# Patient Record
Sex: Female | Born: 1995 | Race: White | Hispanic: No | Marital: Single | Smoking: Former smoker
Health system: Southern US, Community
[De-identification: ages and names within clinical notes are randomized; demographics above are authoritative.]

## PROBLEM LIST (undated history)

## (undated) DIAGNOSIS — Z789 Other specified health status: Secondary | ICD-10-CM

## (undated) HISTORY — PX: NO PAST SURGERIES: SHX2092

---

## 2018-04-16 NOTE — L&D Delivery Note (Signed)
Delivery Note At 9:28 AM a viable female was delivered via Vaginal, Spontaneous (Presentation: LOA;  ).  APGAR: 8, 9; weight pending.   Placenta status: spontaneous, intact.  Cord: 3 vessels with the following complications: nuchal x1  Anesthesia:  epidural Episiotomy: None Lacerations: 2nd degree;Perineal Suture Repair: 2.0 vicryl, 3.0 vicryl Est. Blood Loss (mL):  102  Mom to postpartum.  Baby to Couplet care / Skin to Skin.  Wende Mott CNM 01/17/2019, 9:56 AM

## 2018-06-03 ENCOUNTER — Ambulatory Visit (INDEPENDENT_AMBULATORY_CARE_PROVIDER_SITE_OTHER): Payer: Medicaid Other | Admitting: Advanced Practice Midwife

## 2018-06-03 ENCOUNTER — Encounter: Payer: Self-pay | Admitting: Advanced Practice Midwife

## 2018-06-03 ENCOUNTER — Other Ambulatory Visit (HOSPITAL_COMMUNITY)
Admission: RE | Admit: 2018-06-03 | Discharge: 2018-06-03 | Disposition: A | Discharge status: Home / Self Care | Payer: Medicaid Other | Source: Ambulatory Visit | Attending: Advanced Practice Midwife | Admitting: Advanced Practice Midwife

## 2018-06-03 DIAGNOSIS — Z3400 Encounter for supervision of normal first pregnancy, unspecified trimester: Secondary | ICD-10-CM | POA: Diagnosis not present

## 2018-06-03 DIAGNOSIS — Z3A01 Less than 8 weeks gestation of pregnancy: Secondary | ICD-10-CM | POA: Diagnosis not present

## 2018-06-03 DIAGNOSIS — Z3401 Encounter for supervision of normal first pregnancy, first trimester: Secondary | ICD-10-CM | POA: Diagnosis not present

## 2018-06-03 LAB — POCT URINALYSIS DIPSTICK OB
Blood, UA: NEGATIVE
GLUCOSE, UA: NEGATIVE
Leukocytes, UA: NEGATIVE
Nitrite, UA: NEGATIVE
POC,PROTEIN,UA: NEGATIVE
Spec Grav, UA: 1.02 (ref 1.010–1.025)
pH, UA: 6 (ref 5.0–8.0)

## 2018-06-03 NOTE — Progress Notes (Signed)
DATING AND VIABILITY SONOGRAM   Kelsey Koch [facility] is a 23 y.o. year old G1P0 with LMP Patient's last menstrual period was 04/11/2018 (exact date). which would correlate to  [redacted]w[redacted]d weeks gestation.  She has regular menstrual cycles.   She is here today for a confirmatory initial sonogram. Transvaginal ultrasound used.    GESTATION: SINGLETON     FETAL ACTIVITY:          Heart rate       146          The fetus is active.    ADNEXA: The ovaries are normal.   GESTATIONAL AGE AND  BIOMETRICS:  Gestational criteria: Estimated Date of Delivery: 01/16/19 by LMP now at [redacted]w[redacted]d  Previous Scans:0      CROWN RUMP LENGTH           1.0 cm      7-0 weeks                                                                               AVERAGE EGA(BY THIS SCAN): 7-0weeks  WORKING EDD( LMP ):  01/16/2019     TECHNICIAN COMMENTS:  Patient informed that the ultrasound is considered a limited obstetric ultrasound and is not intended to be a complete ultrasound exam. Patient also informed that the ultrasound is not being completed with the intent of assessing for fetal or placental anomalies or any pelvic abnormalities. Explained that the purpose of today's ultrasound is to assess for fetal heart rate. Patient acknowledges the purpose of the exam and the limitations of the study.    Armandina Stammer 06/03/2018 10:29 AM

## 2018-06-03 NOTE — Progress Notes (Signed)
  Subjective:    Kelsey Koch [facility] is a G1P0 [redacted]w[redacted]d being seen today for her first obstetrical visit.  Her obstetrical history is significant for primagravida. Patient does intend to breast feed. Pregnancy history fully reviewed.  Patient reports some nausea not requiring meds.  .  Vitals:   06/03/18 1003 06/03/18 1004  BP: 115/76   Pulse: 84   Weight: 62.1 kg   Height:  5\' 5"  (1.651 m)    HISTORY: OB History  Gravida Para Term Preterm AB Living  1            SAB TAB Ectopic Multiple Live Births               # Outcome Date GA Lbr Len/2nd Weight Sex Delivery Anes PTL Lv  1 Current            History reviewed. No pertinent past medical history. History reviewed. No pertinent surgical history. Family History  Problem Relation Age of Onset  . Ovarian cysts Mother      Exam    Uterus:     Pelvic Exam:    Perineum: Normal Perineum   Vulva: Bartholin's, Urethra, Skene's normal   Vagina:  normal discharge   pH:    Cervix: no cervical motion tenderness   Adnexa: no mass, fullness, tenderness   Bony Pelvis: gynecoid  System: Breast:  normal appearance, no masses or tenderness   Skin: normal coloration and turgor, no rashes    Neurologic: oriented, grossly non-focal   Extremities: normal strength, tone, and muscle mass   HEENT neck supple with midline trachea   Mouth/Teeth mucous membranes moist, pharynx normal without lesions   Neck supple   Cardiovascular: regular rate and rhythm   Respiratory:  appears well, vitals normal, no respiratory distress, acyanotic, normal RR, ear and throat exam is normal, neck free of mass or lymphadenopathy, chest clear, no wheezing, crepitations, rhonchi, normal symmetric air entry   Abdomen: soft, non-tender; bowel sounds normal; no masses,  no organomegaly   Urinary: bladder not palpable      Assessment:    Pregnancy: G1P0 Patient Active Problem List   Diagnosis Date Noted  . Supervision of normal first pregnancy, antepartum  06/03/2018        Plan:     Initial labs drawn. Prenatal vitamins. Problem list reviewed and updated. Genetic Screening discussed First Screen: will probably want to do Panorama.  Ultrasound discussed; fetal survey: requested.  Follow up in 4 weeks. 50% of 30 min visit spent on counseling and coordination of care.   Welcomed to practice Reviewed how practice works including learners, and move of hospital  Routines reviewed Her roommate is also a patient here.    Wynelle Bourgeois 06/03/2018

## 2018-06-03 NOTE — Patient Instructions (Signed)
First Trimester of Pregnancy  The first trimester of pregnancy is from week 1 until the end of week 13 (months 1 through 3). During this time, your baby will begin to develop inside you. At 6-8 weeks, the eyes and face are formed, and the heartbeat can be seen on ultrasound. At the end of 12 weeks, all the baby's organs are formed. Prenatal care is all the medical care you receive before the birth of your baby. Make sure you get good prenatal care and follow all of your doctor's instructions. Follow these instructions at home: Medicines  Take over-the-counter and prescription medicines only as told by your doctor. Some medicines are safe and some medicines are not safe during pregnancy.  Take a prenatal vitamin that contains at least 600 micrograms (mcg) of folic acid.  If you have trouble pooping (constipation), take medicine that will make your stool soft (stool softener) if your doctor approves. Eating and drinking   Eat regular, healthy meals.  Your doctor will tell you the amount of weight gain that is right for you.  Avoid raw meat and uncooked cheese.  If you feel sick to your stomach (nauseous) or throw up (vomit): ? Eat 4 or 5 small meals a day instead of 3 large meals. ? Try eating a few soda crackers. ? Drink liquids between meals instead of during meals.  To prevent constipation: ? Eat foods that are high in fiber, like fresh fruits and vegetables, whole grains, and beans. ? Drink enough fluids to keep your pee (urine) clear or pale yellow. Activity  Exercise only as told by your doctor. Stop exercising if you have cramps or pain in your lower belly (abdomen) or low back.  Do not exercise if it is too hot, too humid, or if you are in a place of great height (high altitude).  Try to avoid standing for long periods of time. Move your legs often if you must stand in one place for a long time.  Avoid heavy lifting.  Wear low-heeled shoes. Sit and stand up straight.   You can have sex unless your doctor tells you not to. Relieving pain and discomfort  Wear a good support bra if your breasts are sore.  Take warm water baths (sitz baths) to soothe pain or discomfort caused by hemorrhoids. Use hemorrhoid cream if your doctor says it is okay.  Rest with your legs raised if you have leg cramps or low back pain.  If you have puffy, bulging veins (varicose veins) in your legs: ? Wear support hose or compression stockings as told by your doctor. ? Raise (elevate) your feet for 15 minutes, 3-4 times a day. ? Limit salt in your food. Prenatal care  Schedule your prenatal visits by the twelfth week of pregnancy.  Write down your questions. Take them to your prenatal visits.  Keep all your prenatal visits as told by your doctor. This is important. Safety  Wear your seat belt at all times when driving.  Make a list of emergency phone numbers. The list should include numbers for family, friends, the hospital, and police and fire departments. General instructions  Ask your doctor for a referral to a local prenatal class. Begin classes no later than at the start of month 6 of your pregnancy.  Ask for help if you need counseling or if you need help with nutrition. Your doctor can give you advice or tell you where to go for help.  Do not use hot tubs, steam   rooms, or saunas.  Do not douche or use tampons or scented sanitary pads.  Do not cross your legs for long periods of time.  Avoid all herbs and alcohol. Avoid drugs that are not approved by your doctor.  Do not use any tobacco products, including cigarettes, chewing tobacco, and electronic cigarettes. If you need help quitting, ask your doctor. You may get counseling or other support to help you quit.  Avoid cat litter boxes and soil used by cats. These carry germs that can cause birth defects in the baby and can cause a loss of your baby (miscarriage) or stillbirth.  Visit your dentist. At home,  brush your teeth with a soft toothbrush. Be gentle when you floss. Contact a doctor if:  You are dizzy.  You have mild cramps or pressure in your lower belly.  You have a nagging pain in your belly area.  You continue to feel sick to your stomach, you throw up, or you have watery poop (diarrhea).  You have a bad smelling fluid coming from your vagina.  You have pain when you pee (urinate).  You have increased puffiness (swelling) in your face, hands, legs, or ankles. Get help right away if:  You have a fever.  You are leaking fluid from your vagina.  You have spotting or bleeding from your vagina.  You have very bad belly cramping or pain.  You gain or lose weight rapidly.  You throw up blood. It may look like coffee grounds.  You are around people who have German measles, fifth disease, or chickenpox.  You have a very bad headache.  You have shortness of breath.  You have any kind of trauma, such as from a fall or a car accident. Summary  The first trimester of pregnancy is from week 1 until the end of week 13 (months 1 through 3).  To take care of yourself and your unborn baby, you will need to eat healthy meals, take medicines only if your doctor tells you to do so, and do activities that are safe for you and your baby.  Keep all follow-up visits as told by your doctor. This is important as your doctor will have to ensure that your baby is healthy and growing well. This information is not intended to replace advice given to you by your health care provider. Make sure you discuss any questions you have with your health care provider. Document Released: 09/19/2007 Document Revised: 04/10/2016 Document Reviewed: 04/10/2016 Elsevier Interactive Patient Education  2019 Elsevier Inc.  

## 2018-06-04 LAB — GC/CHLAMYDIA PROBE AMP (~~LOC~~) NOT AT ARMC
Chlamydia: NEGATIVE
Neisseria Gonorrhea: NEGATIVE

## 2018-06-05 LAB — CULTURE, OB URINE

## 2018-06-05 LAB — URINE CULTURE, OB REFLEX

## 2018-06-13 LAB — OBSTETRIC PANEL, INCLUDING HIV
Antibody Screen: NEGATIVE
BASOS: 1 %
Basophils Absolute: 0 10*3/uL (ref 0.0–0.2)
EOS (ABSOLUTE): 0.1 10*3/uL (ref 0.0–0.4)
Eos: 2 %
HEMATOCRIT: 37.1 % (ref 34.0–46.6)
HIV SCREEN 4TH GENERATION: NONREACTIVE
Hemoglobin: 12.8 g/dL (ref 11.1–15.9)
Hepatitis B Surface Ag: NEGATIVE
Immature Grans (Abs): 0 10*3/uL (ref 0.0–0.1)
Immature Granulocytes: 0 %
Lymphocytes Absolute: 2.2 10*3/uL (ref 0.7–3.1)
Lymphs: 29 %
MCH: 30.9 pg (ref 26.6–33.0)
MCHC: 34.5 g/dL (ref 31.5–35.7)
MCV: 90 fL (ref 79–97)
Monocytes Absolute: 0.7 10*3/uL (ref 0.1–0.9)
Monocytes: 10 %
NEUTROS ABS: 4.5 10*3/uL (ref 1.4–7.0)
Neutrophils: 58 %
Platelets: 250 10*3/uL (ref 150–450)
RBC: 4.14 x10E6/uL (ref 3.77–5.28)
RDW: 12.4 % (ref 11.7–15.4)
RH TYPE: POSITIVE
RPR Ser Ql: NONREACTIVE
Rubella Antibodies, IGG: 3.61 index (ref >0.99)
WBC: 7.6 10*3/uL (ref 3.4–10.8)

## 2018-06-13 LAB — SMN1 COPY NUMBER ANALYSIS (SMA CARRIER SCREENING)

## 2018-06-13 LAB — CYSTIC FIBROSIS GENE TEST

## 2018-07-01 ENCOUNTER — Ambulatory Visit (INDEPENDENT_AMBULATORY_CARE_PROVIDER_SITE_OTHER): Payer: Medicaid Other | Admitting: Advanced Practice Midwife

## 2018-07-01 ENCOUNTER — Encounter: Payer: Self-pay | Admitting: Advanced Practice Midwife

## 2018-07-01 ENCOUNTER — Other Ambulatory Visit: Payer: Self-pay

## 2018-07-01 VITALS — BP 112/73 | HR 91 | Wt 138.0 lb

## 2018-07-01 DIAGNOSIS — Z3400 Encounter for supervision of normal first pregnancy, unspecified trimester: Secondary | ICD-10-CM

## 2018-07-01 DIAGNOSIS — Z3401 Encounter for supervision of normal first pregnancy, first trimester: Secondary | ICD-10-CM

## 2018-07-01 DIAGNOSIS — Z3A11 11 weeks gestation of pregnancy: Secondary | ICD-10-CM

## 2018-07-01 NOTE — Progress Notes (Signed)
   PRENATAL VISIT NOTE  Subjective:  Kelsey Koch [facility] is a 23 y.o. G1P0 at [redacted]w[redacted]d being seen today for ongoing prenatal care.  She is currently monitored for the following issues for this low-risk pregnancy and has Supervision of normal first pregnancy, antepartum on their problem list.  Patient reports no complaints and occasional nausea, probably once a day.  Contractions: Not present. Vag. Bleeding: None.   . Denies leaking of fluid.   The following portions of the patient's history were reviewed and updated as appropriate: allergies, current medications, past family history, past medical history, past social history, past surgical history and problem list.   Objective:   Vitals:   07/01/18 1126  BP: 112/73  Pulse: 91  Weight: 62.6 kg    Fetal Status: Fetal Heart Rate (bpm): 160 Fundal Height: 11 cm       General:  Alert, oriented and cooperative. Patient is in no acute distress.  Skin: Skin is warm and dry. No rash noted.   Cardiovascular: Normal heart rate noted  Respiratory: Normal respiratory effort, no problems with respiration noted  Abdomen: Soft, gravid, appropriate for gestational age.  Pain/Pressure: Present     Pelvic: Cervical exam deferred        Extremities: Normal range of motion.  Edema: None  Mental Status: Normal mood and affect. Normal behavior. Normal judgment and thought content.   Assessment and Plan:  Pregnancy: G1P0 at [redacted]w[redacted]d 1. Supervision of normal first pregnancy, antepartum      Reviewed warning signs      Reviewed general precautions re: Covid-19      Plan Panorama at next visit to optimize yield  - [place] MFM OB COMP + 14 WK; Future  Preterm labor symptoms and general obstetric precautions including but not limited to vaginal bleeding, contractions, leaking of fluid and fetal movement were reviewed in detail with the patient. Please refer to After Visit Summary for other counseling recommendations.   Return in about 4 weeks (around 07/29/2018) for  [facility].  Future Appointments  Date Time Provider Department Center  07/29/2018 10:30 AM Aviva Signs, CNM [facility] None  08/22/2018  1:45 PM [facility] [place] 2 [facility]    Wynelle Bourgeois, CNM

## 2018-07-01 NOTE — Patient Instructions (Signed)

## 2018-07-12 DIAGNOSIS — Z3A01 Less than 8 weeks gestation of pregnancy: Secondary | ICD-10-CM | POA: Diagnosis not present

## 2018-07-12 DIAGNOSIS — O9989 Other specified diseases and conditions complicating pregnancy, childbirth and the puerperium: Secondary | ICD-10-CM | POA: Diagnosis not present

## 2018-07-12 DIAGNOSIS — R51 Headache: Secondary | ICD-10-CM | POA: Diagnosis not present

## 2018-07-12 DIAGNOSIS — G40909 Epilepsy, unspecified, not intractable, without status epilepticus: Secondary | ICD-10-CM | POA: Diagnosis not present

## 2018-07-12 DIAGNOSIS — R112 Nausea with vomiting, unspecified: Secondary | ICD-10-CM | POA: Diagnosis not present

## 2018-07-29 ENCOUNTER — Encounter: Payer: Self-pay | Admitting: Advanced Practice Midwife

## 2018-07-29 ENCOUNTER — Other Ambulatory Visit (HOSPITAL_COMMUNITY): Payer: Self-pay | Admitting: Advanced Practice Midwife

## 2018-07-29 ENCOUNTER — Other Ambulatory Visit: Payer: Self-pay

## 2018-07-29 ENCOUNTER — Ambulatory Visit (INDEPENDENT_AMBULATORY_CARE_PROVIDER_SITE_OTHER): Payer: Medicaid Other | Admitting: Advanced Practice Midwife

## 2018-07-29 VITALS — BP 104/71 | HR 91 | Temp 99.0°F | Wt 140.1 lb

## 2018-07-29 DIAGNOSIS — Z3400 Encounter for supervision of normal first pregnancy, unspecified trimester: Secondary | ICD-10-CM

## 2018-07-29 DIAGNOSIS — Z3402 Encounter for supervision of normal first pregnancy, second trimester: Secondary | ICD-10-CM

## 2018-07-29 DIAGNOSIS — Z3482 Encounter for supervision of other normal pregnancy, second trimester: Secondary | ICD-10-CM | POA: Diagnosis not present

## 2018-07-29 DIAGNOSIS — Z3A15 15 weeks gestation of pregnancy: Secondary | ICD-10-CM

## 2018-07-29 NOTE — Patient Instructions (Signed)
Second Trimester of Pregnancy  The second trimester is from week 14 through week 27 (month 4 through 6). This is often the time in pregnancy that you feel your best. Often times, morning sickness has lessened or quit. You may have more energy, and you may get hungry more often. Your unborn baby is growing rapidly. At the end of the sixth month, he or she is about 9 inches long and weighs about 1 pounds. You will likely feel the baby move between 18 and 20 weeks of pregnancy. Follow these instructions at home: Medicines  Take over-the-counter and prescription medicines only as told by your doctor. Some medicines are safe and some medicines are not safe during pregnancy.  Take a prenatal vitamin that contains at least 600 micrograms (mcg) of folic acid.  If you have trouble pooping (constipation), take medicine that will make your stool soft (stool softener) if your doctor approves. Eating and drinking   Eat regular, healthy meals.  Avoid raw meat and uncooked cheese.  If you get low calcium from the food you eat, talk to your doctor about taking a daily calcium supplement.  Avoid foods that are high in fat and sugars, such as fried and sweet foods.  If you feel sick to your stomach (nauseous) or throw up (vomit): ? Eat 4 or 5 small meals a day instead of 3 large meals. ? Try eating a few soda crackers. ? Drink liquids between meals instead of during meals.  To prevent constipation: ? Eat foods that are high in fiber, like fresh fruits and vegetables, whole grains, and beans. ? Drink enough fluids to keep your pee (urine) clear or pale yellow. Activity  Exercise only as told by your doctor. Stop exercising if you start to have cramps.  Do not exercise if it is too hot, too humid, or if you are in a place of great height (high altitude).  Avoid heavy lifting.  Wear low-heeled shoes. Sit and stand up straight.  You can continue to have sex unless your doctor tells you not to.  Relieving pain and discomfort  Wear a good support bra if your breasts are tender.  Take warm water baths (sitz baths) to soothe pain or discomfort caused by hemorrhoids. Use hemorrhoid cream if your doctor approves.  Rest with your legs raised if you have leg cramps or low back pain.  If you develop puffy, bulging veins (varicose veins) in your legs: ? Wear support hose or compression stockings as told by your doctor. ? Raise (elevate) your feet for 15 minutes, 3-4 times a day. ? Limit salt in your food. Prenatal care  Write down your questions. Take them to your prenatal visits.  Keep all your prenatal visits as told by your doctor. This is important. Safety  Wear your seat belt when driving.  Make a list of emergency phone numbers, including numbers for family, friends, the hospital, and police and fire departments. General instructions  Ask your doctor about the right foods to eat or for help finding a counselor, if you need these services.  Ask your doctor about local prenatal classes. Begin classes before month 6 of your pregnancy.  Do not use hot tubs, steam rooms, or saunas.  Do not douche or use tampons or scented sanitary pads.  Do not cross your legs for long periods of time.  Visit your dentist if you have not done so. Use a soft toothbrush to brush your teeth. Floss gently.  Avoid all smoking, herbs,   and alcohol. Avoid drugs that are not approved by your doctor.  Do not use any products that contain nicotine or tobacco, such as cigarettes and e-cigarettes. If you need help quitting, ask your doctor.  Avoid cat litter boxes and soil used by cats. These carry germs that can cause birth defects in the baby and can cause a loss of your baby (miscarriage) or stillbirth. Contact a doctor if:  You have mild cramps or pressure in your lower belly.  You have pain when you pee (urinate).  You have bad smelling fluid coming from your vagina.  You continue to  feel sick to your stomach (nauseous), throw up (vomit), or have watery poop (diarrhea).  You have a nagging pain in your belly area.  You feel dizzy. Get help right away if:  You have a fever.  You are leaking fluid from your vagina.  You have spotting or bleeding from your vagina.  You have severe belly cramping or pain.  You lose or gain weight rapidly.  You have trouble catching your breath and have chest pain.  You notice sudden or extreme puffiness (swelling) of your face, hands, ankles, feet, or legs.  You have not felt the baby move in over an hour.  You have severe headaches that do not go away when you take medicine.  You have trouble seeing. Summary  The second trimester is from week 14 through week 27 (months 4 through 6). This is often the time in pregnancy that you feel your best.  To take care of yourself and your unborn baby, you will need to eat healthy meals, take medicines only if your doctor tells you to do so, and do activities that are safe for you and your baby.  Call your doctor if you get sick or if you notice anything unusual about your pregnancy. Also, call your doctor if you need help with the right food to eat, or if you want to know what activities are safe for you. This information is not intended to replace advice given to you by your health care provider. Make sure you discuss any questions you have with your health care provider. Document Released: 06/27/2009 Document Revised: 05/08/2016 Document Reviewed: 05/08/2016 Elsevier Interactive Patient Education  2019 [employer] Inc[employer] Genetic Testing During Pregnancy Genetic testing during pregnancy is also called prenatal genetic testing. This type of testing can determine if your baby is at risk of being born with a disorder caused by abnormal genes or chromosomes (genetic disorder). Chromosomes contain genes that control how your baby will develop in your womb. There are many different genetic  disorders. Examples of genetic disorders that may be found through genetic testing include Down syndrome and cystic fibrosis. Gene changes (mutations) can be passed down through families. Genetic testing is offered to all women before or during pregnancy. You can choose whether to have genetic testing. Why is genetic testing done? Genetic testing is done during pregnancy to find out whether your child is at risk for a genetic disorder. Having genetic testing allows you to:  Discuss your test results and options with a genetic counselor.  Prepare for a baby that may be born with a genetic disorder. Learning about the disorder ahead of time helps you be better prepared to manage it. Your health care providers can also be prepared in case your baby requires special care before or after birth.  Consider whether you want to continue with the pregnancy. In some cases, genetic testing may be done   to learn about the traits a child will inherit. Types of genetic tests There are two basic types of genetic testing. Screening tests indicate whether your developing baby (fetus) is at higher risk for a genetic disorder. Diagnostic tests check actual fetal cells to diagnose a genetic disorder. Screening tests     Screening tests will not harm your baby. They are recommended for all pregnant women. Types of screening tests include:  Carrier screening. This test involves checking genes from both parents by testing their blood or saliva. The test checks to find out if the parents carry a genetic mutation that may be passed to a baby. In most cases, both parents must carry the mutation for a baby to be at risk.  First trimester screening. This test combines a blood test with sound wave imaging of your baby (fetal ultrasound). This screening test checks for a risk of Down syndrome or other defects caused by having extra chromosomes. It also checks for defects of the heart, abdomen, or skeleton.  Second trimester  screening also combines a blood test with a fetal ultrasound exam. It checks for a risk of genetic defects of the face, brain, spine, heart, or limbs.  Combined or sequential screening. This type of testing combines the results of first and second trimester screening. This type of testing may be more accurate than first or second trimester screening alone.  Cell-free DNA testing. This is a blood test that detects cells released by the placenta that get into the mother's blood. It can be used to check for a risk of Down syndrome, other extra chromosome syndromes, and disorders caused by abnormal numbers of sex chromosomes. This test can be done any time after 10 weeks of pregnancy.  Diagnostic tests Diagnostic tests carry slight risks of problems, including bleeding, infection, and loss of the pregnancy. These tests are done only if your baby is at risk for a genetic disorder. You may meet with a genetic counselor to discuss the risks and benefits before having diagnostic tests. Examples of diagnostic tests include:  Chorionic villus sampling (CVS). This involves a procedure to remove and test a sample of cells taken from the placenta. The procedure may be done between 10 and 12 weeks of pregnancy.  Amniocentesis. This involves a procedure to remove and test a sample of fluid (amniotic fluid) and cells from the sac that surrounds the developing baby. The procedure may be done between 15 and 20 weeks of pregnancy. What do the results mean? For a screening test:  If the results are negative, it often means that your child is not at higher risk. There is still a slight chance your child could have a genetic disorder.  If the results are positive, it does not mean your child will have a genetic disorder. It may mean that your child has a higher-than-normal risk for a genetic disorder. In that case, you may want to talk with a genetic counselor about whether you should have diagnostic genetic tests. For  a diagnostic test:  If the result is negative, it is unlikely that your child will have a genetic disorder.  If the test is positive for a genetic disorder, it is likely that your child will have the disorder. The test may not tell how severe the disorder will be. Talk with your health care provider about your options. Questions to ask your health care provider Before talking to your health care provider about genetic testing, find out if there is a history of   genetic disorders in your family. It may also help to know your family's ethnic origins. Then ask your health care provider the following questions:  Is my baby at risk for a genetic disorder?  What are the benefits of having genetic screening?  What tests are best for me and my baby?  What are the risks of each test?  If I get a positive result on a screening test, what is the next step?  Should I meet with a genetic counselor before having a diagnostic test?  Should my partner or other members of my family be tested?  How much do the tests cost? Will my insurance cover the testing? Summary  Genetic testing is done during pregnancy to find out whether your child is at risk for a genetic disorder.  Genetic testing is offered to all women before or during pregnancy. You can choose whether to have genetic testing.  There are two basic types of genetic testing. Screening tests indicate whether your developing baby (fetus) is at higher risk for a genetic disorder. Diagnostic tests check actual fetal cells to diagnose a genetic disorder.  If a diagnostic genetic test is positive, talk with your health care provider about your options. This information is not intended to replace advice given to you by your health care provider. Make sure you discuss any questions you have with your health care provider. Document Released: 06/17/2017 Document Revised: 06/17/2017 Document Reviewed: 06/17/2017 Elsevier Interactive Patient Education   2019 [employer].

## 2018-07-29 NOTE — Progress Notes (Signed)
   PRENATAL VISIT NOTE  In-Person visit  Subjective:  Kelsey Koch is a 23 y.o. G1P0 at [redacted]w[redacted]d being seen today for ongoing prenatal care.  She is currently monitored for the following issues for this low-risk pregnancy and has Supervision of normal first pregnancy, antepartum on their problem list.  Patient reports no complaints.  Contractions: Not present. Vag. Bleeding: None.  Movement: Absent. Denies leaking of fluid.   The following portions of the patient's history were reviewed and updated as appropriate: allergies, current medications, past family history, past medical history, past social history, past surgical history and problem list.   Objective:   Vitals:   07/29/18 1036  BP: 104/71  Pulse: 91  Temp: 99 F (37.2 C)  Weight: 63.5 kg    Fetal Status: Fetal Heart Rate (bpm): 145 Fundal Height: 16 cm Movement: Absent     General:  Alert, oriented and cooperative. Patient is in no acute distress.  Skin: Skin is warm and dry. No rash noted.   Cardiovascular: Normal heart rate noted  Respiratory: Normal respiratory effort, no problems with respiration noted  Abdomen: Soft, gravid, appropriate for gestational age.  Pain/Pressure: Absent     Pelvic: Cervical exam deferred        Extremities: Normal range of motion.  Edema: None  Mental Status: Normal mood and affect. Normal behavior. Normal judgment and thought content.   Assessment and Plan:  Pregnancy: G1P0 at [redacted]w[redacted]d 1. Supervision of normal first pregnancy, antepartum      Will do Panorama and AFP today - Genetic Screening - AFP, Serum, Open Spina Bifida       Reviewed warning signs.       Has anatomy US scheduled  Preterm labor symptoms and general obstetric precautions including but not limited to vaginal bleeding, contractions, leaking of fluid and fetal movement were reviewed in detail with the patient. Please refer to After Visit Summary for other counseling recommendations.   Return in about 4 weeks (around  08/26/2018) for TELEHEALTH VISIT.  Future Appointments  Date Time Provider Department Center  08/22/2018  1:45 PM [facility] [place] 2 [facility]  09/01/2018  9:00 AM Willodean Rosenthal, MD [facility] None    Wynelle Bourgeois, CNM

## 2018-07-31 LAB — AFP, SERUM, OPEN SPINA BIFIDA
AFP MoM: 0.49
AFP Value: 14.8 ng/mL
Gest. Age on Collection Date: 15 weeks
Maternal Age At EDD: 20.9 yr
OSBR Risk 1 IN: 10000
Test Results:: NEGATIVE
Weight: 140 [lb_av]

## 2018-08-05 ENCOUNTER — Telehealth: Payer: Self-pay

## 2018-08-05 NOTE — Telephone Encounter (Signed)
Pt called the office requesting Panorama results. Pt made aware that Panorama results are normal and that she is having a girl. Understanding was voiced. Kelsey Koch l [name], CMA

## 2018-08-22 ENCOUNTER — Ambulatory Visit (HOSPITAL_COMMUNITY)
Admission: RE | Admit: 2018-08-22 | Discharge: 2018-08-22 | Disposition: A | Discharge status: Home / Self Care | Payer: Medicaid Other | Source: Ambulatory Visit | Attending: Obstetrics and Gynecology | Admitting: Obstetrics and Gynecology

## 2018-08-22 ENCOUNTER — Other Ambulatory Visit: Payer: Self-pay

## 2018-08-22 ENCOUNTER — Other Ambulatory Visit: Payer: Self-pay | Admitting: Advanced Practice Midwife

## 2018-08-22 DIAGNOSIS — Z3400 Encounter for supervision of normal first pregnancy, unspecified trimester: Secondary | ICD-10-CM | POA: Diagnosis not present

## 2018-08-22 DIAGNOSIS — Z363 Encounter for antenatal screening for malformations: Secondary | ICD-10-CM

## 2018-08-22 DIAGNOSIS — Z3A19 19 weeks gestation of pregnancy: Secondary | ICD-10-CM | POA: Diagnosis not present

## 2018-08-22 DIAGNOSIS — O359XX0 Maternal care for (suspected) fetal abnormality and damage, unspecified, not applicable or unspecified: Secondary | ICD-10-CM

## 2018-09-01 ENCOUNTER — Encounter: Payer: Self-pay | Admitting: Obstetrics & Gynecology

## 2018-09-01 ENCOUNTER — Ambulatory Visit (INDEPENDENT_AMBULATORY_CARE_PROVIDER_SITE_OTHER): Payer: Medicaid Other | Admitting: Obstetrics & Gynecology

## 2018-09-01 VITALS — Wt 142.0 lb

## 2018-09-01 DIAGNOSIS — Z3400 Encounter for supervision of normal first pregnancy, unspecified trimester: Secondary | ICD-10-CM

## 2018-09-01 DIAGNOSIS — Z3402 Encounter for supervision of normal first pregnancy, second trimester: Secondary | ICD-10-CM

## 2018-09-01 NOTE — Progress Notes (Signed)
   TELEHEALTH VIRTUAL OBSTETRICS VISIT ENCOUNTER NOTE  I connected with Criss M Grabill on 09/01/18 at  8:45 AM EDT by telephone at home and verified that I am speaking with the correct person using two identifiers.   I discussed the limitations, risks, security and privacy concerns of performing an evaluation and management service by telephone and the availability of in person appointments. I also discussed with the patient that there may be a patient responsible charge related to this service. The patient expressed understanding and agreed to proceed.  Subjective:  Kelsey Koch is a 23 y.o. G1P0 at [redacted]w[redacted]d being followed for ongoing prenatal care.  She is currently monitored for the following issues for this low-risk pregnancy and has Supervision of normal first pregnancy, antepartum on their problem list.  Patient reports no complaints. Reports fetal movement. Denies any contractions, bleeding or leaking of fluid.   The following portions of the patient's history were reviewed and updated as appropriate: allergies, current medications, past family history, past medical history, past social history, past surgical history and problem list.   Objective:  BP 108/89 General:  Alert, oriented and cooperative.   Mental Status: Normal mood and affect perceived. Normal judgment and thought content.  Rest of physical exam deferred due to type of encounter  Assessment and Plan:  Pregnancy: G1P0 at [redacted]w[redacted]d 1. Supervision of normal first pregnancy, antepartum Good FM. No complaints.  reviewed ECF on [place] and normal genetic testing.  Questions answered.    Preterm labor symptoms and general obstetric precautions including but not limited to vaginal bleeding, contractions, leaking of fluid and fetal movement were reviewed in detail with the patient.  I discussed the assessment and treatment plan with the patient. The patient was provided an opportunity to ask questions and all were answered. The  patient agreed with the plan and demonstrated an understanding of the instructions. The patient was advised to call back or seek an in-person office evaluation/go to [facility] at [facility] for any urgent or concerning symptoms. Please refer to After Visit Summary for other counseling recommendations.   I provided 12 minutes of non-face-to-face time during this encounter.  Return in about 4 weeks (around 09/29/2018) for via telehealth .  No future appointments.  Willodean Rosenthal, MD [facility] [employer], [facility]

## 2018-09-17 ENCOUNTER — Telehealth: Payer: Self-pay
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
## 2018-09-30 ENCOUNTER — Ambulatory Visit (INDEPENDENT_AMBULATORY_CARE_PROVIDER_SITE_OTHER): Payer: Medicaid Other | Admitting: Advanced Practice Midwife

## 2018-09-30 ENCOUNTER — Encounter: Payer: Self-pay | Admitting: Advanced Practice Midwife

## 2018-09-30 ENCOUNTER — Other Ambulatory Visit (HOSPITAL_COMMUNITY)
Admission: RE | Admit: 2018-09-30 | Discharge: 2018-09-30 | Disposition: A | Discharge status: Home / Self Care | Payer: Medicaid Other | Source: Ambulatory Visit | Attending: Obstetrics & Gynecology | Admitting: Obstetrics & Gynecology

## 2018-09-30 ENCOUNTER — Other Ambulatory Visit: Payer: Self-pay

## 2018-09-30 VITALS — BP 106/70 | HR 80 | Wt 156.0 lb

## 2018-09-30 DIAGNOSIS — N898 Other specified noninflammatory disorders of vagina: Secondary | ICD-10-CM | POA: Diagnosis not present

## 2018-09-30 DIAGNOSIS — F489 Nonpsychotic mental disorder, unspecified: Secondary | ICD-10-CM

## 2018-09-30 DIAGNOSIS — O99342 Other mental disorders complicating pregnancy, second trimester: Secondary | ICD-10-CM

## 2018-09-30 DIAGNOSIS — F319 Bipolar disorder, unspecified: Secondary | ICD-10-CM | POA: Diagnosis not present

## 2018-09-30 DIAGNOSIS — Z3A24 24 weeks gestation of pregnancy: Secondary | ICD-10-CM

## 2018-09-30 DIAGNOSIS — Z3400 Encounter for supervision of normal first pregnancy, unspecified trimester: Secondary | ICD-10-CM

## 2018-09-30 MED ORDER — TERCONAZOLE 0.4 % VA CREA: 1.0000 | TOPICAL_CREAM | Freq: Every day | VAGINAL | 0 refills | Status: DC

## 2018-09-30 NOTE — Progress Notes (Signed)
   PRENATAL VISIT NOTE  Subjective:  Kelsey Koch is a 23 y.o. G1P0 at [redacted]w[redacted]d being seen today for ongoing prenatal care.  She is currently monitored for the following issues for this low-risk pregnancy and has Supervision of normal first pregnancy, antepartum and Vaginal itching on their problem list.  Patient reports vaginal irritation.  Contractions: Not present. Vag. Bleeding: Scant.  Movement: Present. Denies leaking of fluid.  Did have 1 days of light pink spotting last week with mild cramps   None since then.   States has had some "manic depression" symptoms.  States was diagnosed by a family friend (MD) as a child but "never official".  Strong family hx on both sides.  Has several days "of mania" and then several days "where I can't get out of bed".  Wants to be referred for treatment.   Has had success with therapy in past.   The following portions of the patient's history were reviewed and updated as appropriate: allergies, current medications, past family history, past medical history, past social history, past surgical history and problem list.   Objective:   Vitals:   09/30/18 1003  BP: 106/70  Pulse: 80  Weight: 70.8 kg    Fetal Status: Fetal Heart Rate (bpm): 143 Fundal Height: 25 cm Movement: Present  Presentation: Transverse  General:  Alert, oriented and cooperative. Patient is in no acute distress.  Skin: Skin is warm and dry. No rash noted.   Cardiovascular: Normal heart rate noted  Respiratory: Normal respiratory effort, no problems with respiration noted  Abdomen: Soft, gravid, appropriate for gestational age.  Pain/Pressure: Present     Pelvic: Cervical exam deferred       External genitalia has erethema and is tender to touch.  No lesions identified  Extremities: Normal range of motion.  Edema: Trace  Mental Status: Normal mood and affect. Normal behavior. Normal judgment and thought content.   Assessment and Plan:  Pregnancy: G1P0 at [redacted]w[redacted]d 1. Vaginal  discharge     Rx Terazol 7 for presumed yeast vaginitis - Cervicovaginal ancillary only( )  2. Mental health problem     Referred to Mental Health for diagnosis and plan  - Ambulatory referral to Behavioral Health  3. Supervision of normal first pregnancy, antepartum    Preterm labor symptoms and general obstetric precautions including but not limited to vaginal bleeding, contractions, leaking of fluid and fetal movement were reviewed in detail with the patient. Please refer to After Visit Summary for other counseling recommendations.    Future Appointments  Date Time Provider [place]  10/28/2018 10:45 AM Seabron Spates, CNM [facility] None    Hansel Feinstein, CNM

## 2018-09-30 NOTE — Patient Instructions (Signed)
Bipolar 1 Disorder Bipolar 1 disorder is a mental health disorder in which a person has episodes of emotional highs (mania), and may also have episodes of emotional lows (depression) in addition to highs. Bipolar 1 disorder is different from other bipolar disorders because it involves extreme manic episodes. These episodes last at least one week or involve symptoms that are so severe that hospitalization is needed to keep the person safe. What increases the risk? The cause of this condition is not known. However, certain factors make you more likely to have bipolar disorder, such as:  Having a family member with the disorder.  An imbalance of certain chemicals in the brain (neurotransmitters).  Stress, such as illness, financial problems, or a death.  Certain conditions that affect the brain or spinal cord (neurologic conditions).  Brain injury (trauma).  Having another mental health disorder, such as: ? Obsessive compulsive disorder. ? Schizophrenia. What are the signs or symptoms? Symptoms of mania include:  Very high self-esteem or self-confidence.  Decreased need for sleep.  Unusual talkativeness or feeling a need to keep talking. Speech may be very fast. It may seem like you cannot stop talking.  Racing thoughts or constant talking, with quick shifts between topics that may or may not be related (flight of ideas).  Decreased ability to focus or concentrate.  Increased purposeful activity, such as work, studies, or social activity.  Increased nonproductive activity. This could be pacing, squirming and fidgeting, or finger and toe tapping.  Impulsive behavior and poor judgment. This may result in high-risk activities, such as having unprotected sex or spending a lot of money. Symptoms of depression include:  Feeling sad, hopeless, or helpless.  Frequent or uncontrollable crying.  Lack of feeling or caring about anything.  Sleeping too much.  Moving more slowly than  usual.  Not being able to enjoy things you used to enjoy.  Wanting to be alone all the time.  Feeling guilty or worthless.  Lack of energy or motivation.  Trouble concentrating or remembering.  Trouble making decisions.  Increased appetite.  Thoughts of death, or the desire to harm yourself. Sometimes, you may have a mixed mood. This means having symptoms of depression and mania. Stress can make symptoms worse. How is this diagnosed? To diagnose bipolar disorder, your health care provider may ask about your:  Emotional episodes.  Medical history.  Alcohol and drug use. This includes prescription medicines. Certain medical conditions and substances can cause symptoms that seem like bipolar disorder (secondary bipolar disorder). How is this treated? Bipolar disorder is a long-term (chronic) illness. It is best controlled with ongoing (continuous) treatment rather than treatment only when symptoms occur. Treatment may include:  Medicine. Medicine can be prescribed by a provider who specializes in treating mental disorders (psychiatrist). ? Medicines called mood stabilizers are usually prescribed. ? If symptoms occur even while taking a mood stabilizer, other medicines may be added.  Psychotherapy. Some forms of talk therapy, such as cognitive-behavioral therapy (CBT), can provide support, education, and guidance.  Coping methods, such as journaling or relaxation exercises. These may include: ? Yoga. ? Meditation. ? Deep breathing.  Lifestyle changes, such as: ? Limiting alcohol and drug use. ? Exercising regularly. ? Getting plenty of sleep. ? Making healthy eating choices.  A combination of medicine, talk therapy, and coping methods is best. A procedure in which electricity is applied to the brain through the scalp (electroconvulsive therapy) may be used in cases of severe mania when medicine and psychotherapy work too   slowly or do not work. Follow these instructions at  home: Activity   Return to your normal activities as told by your health care provider.  Find activities that you enjoy, and make time to do them.  Exercise regularly as told by your health care provider. Lifestyle  Limit alcohol intake to no more than 1 drink a day for nonpregnant women and 2 drinks a day for men. One drink equals 12 oz of beer, 5 oz of wine, or 1 oz of hard liquor.  Follow a set schedule for eating and sleeping.  Eat a balanced diet that includes fresh fruits and vegetables, whole grains, low-fat dairy, and lean meat.  Get 7-8 hours of sleep each night. General instructions  Take over-the-counter and prescription medicines only as told by your health care provider.  Think about joining a support group. Your health care provider may be able to recommend a support group.  Talk with your family and loved ones about your treatment goals and how they can help.  Keep all follow-up visits as told by your health care provider. This is important. Where to find more information For more information about bipolar disorder, visit the following websites:  [employer] on Mental Illness: www.nami.[place]: https://carter.com/ Contact a health care provider if:  Your symptoms get worse.  You have side effects from your medicine, and they get worse.  You have trouble sleeping.  You have trouble doing daily activities.  You feel unsafe in your surroundings.  You are dealing with substance abuse. Get help right away if:  You have new symptoms.  You have thoughts about harming yourself.  You self-harm. This information is not intended to replace advice given to you by your health care provider. Make sure you discuss any questions you have with your health care provider. Document Released: 07/09/2000 Document Revised: 11/27/2015 Document Reviewed: 12/01/2015 Elsevier Interactive Patient Education  2019 Elsevier Inc.  Vaginal Yeast infection, Adult  Vaginal yeast infection is a condition that causes vaginal discharge as well as soreness, swelling, and redness (inflammation) of the vagina. This is a common condition. Some women get this infection frequently. What are the causes? This condition is caused by a change in the normal balance of the yeast (candida) and bacteria that live in the vagina. This change causes an overgrowth of yeast, which causes the inflammation. What increases the risk? The condition is more likely to develop in women who:  Take antibiotic medicines.  Have diabetes.  Take birth control pills.  Are pregnant.  Douche often.  Have a weak body defense system (immune system).  Have been taking steroid medicines for a long time.  Frequently wear tight clothing. What are the signs or symptoms? Symptoms of this condition include:  White, thick, creamy vaginal discharge.  Swelling, itching, redness, and irritation of the vagina. The lips of the vagina (vulva) may be affected as well.  Pain or a burning feeling while urinating.  Pain during sex. How is this diagnosed? This condition is diagnosed based on:  Your medical history.  A physical exam.  A pelvic exam. Your health care provider will examine a sample of your vaginal discharge under a microscope. Your health care provider may send this sample for testing to confirm the diagnosis. How is this treated? This condition is treated with medicine. Medicines may be over-the-counter or prescription. You may be told to use one or more of the following:  Medicine that is taken by  mouth (orally).  Medicine that is applied as a cream (topically).  Medicine that is inserted directly into the vagina (suppository). Follow these instructions at home:  Lifestyle  Do not have sex until your health care provider approves. Tell your sex partner that you have a yeast infection. That person should go to his or her health care  provider and ask if they should also be treated.  Do not wear tight clothes, such as pantyhose or tight pants.  Wear breathable cotton underwear. General instructions  Take or apply over-the-counter and prescription medicines only as told by your health care provider.  Eat more yogurt. This may help to keep your yeast infection from returning.  Do not use tampons until your health care provider approves.  Try taking a sitz bath to help with discomfort. This is a warm water bath that is taken while you are sitting down. The water should only come up to your hips and should cover your buttocks. Do this 3-4 times per day or as told by your health care provider.  Do not douche.  If you have diabetes, keep your blood sugar levels under control.  Keep all follow-up visits as told by your health care provider. This is important. Contact a health care provider if:  You have a fever.  Your symptoms go away and then return.  Your symptoms do not get better with treatment.  Your symptoms get worse.  You have new symptoms.  You develop blisters in or around your vagina.  You have blood coming from your vagina and it is not your menstrual period.  You develop pain in your abdomen. Summary  Vaginal yeast infection is a condition that causes discharge as well as soreness, swelling, and redness (inflammation) of the vagina.  This condition is treated with medicine. Medicines may be over-the-counter or prescription.  Take or apply over-the-counter and prescription medicines only as told by your health care provider.  Do not douche. Do not have sex or use tampons until your health care provider approves.  Contact a health care provider if your symptoms do not get better with treatment or your symptoms go away and then return. This information is not intended to replace advice given to you by your health care provider. Make sure you discuss any questions you have with your health care  provider. Document Released: 01/10/2005 Document Revised: 08/19/2017 Document Reviewed: 08/19/2017 Elsevier Interactive Patient Education  2019 [employer]

## 2018-09-30 NOTE — Progress Notes (Deleted)
  Subjective:    Kelsey Koch is being seen today for her first obstetrical visit.  This {is/is not:9024} a planned pregnancy. She is at [redacted]w[redacted]d gestation. Her obstetrical history is significant for {ob risk factors:10154}. Relationship with FOB: {fob:16621}. Patient {does/does not:19097} intend to breast feed. Pregnancy history fully reviewed.  Patient reports {sx:14538}.  Review of Systems:   Review of Systems  Objective:     BP 106/70   Pulse 80   Wt 70.8 kg   LMP 04/11/2018 (Exact Date)   BMI 25.97 kg/m  Physical Exam  Exam    Assessment:    Pregnancy: G1P0 Patient Active Problem List   Diagnosis Date Noted  . Vaginal itching 09/30/2018  . Supervision of normal first pregnancy, antepartum 06/03/2018       Plan:     Initial labs drawn. Prenatal vitamins. Problem list reviewed and updated. AFP3 discussed: {requests/ordered/declines:14581}. Role of ultrasound in pregnancy discussed; fetal survey: {requests/ordered/declines:14581}. Amniocentesis discussed: {amniocentesis:14582}. Follow up in {numbers 0-4:31231} weeks. ***% of *** min visit spent on counseling and coordination of care.  ***   Hansel Feinstein 09/30/2018

## 2018-10-01 LAB — CERVICOVAGINAL ANCILLARY ONLY
Bacterial vaginitis: POSITIVE — AB
Candida vaginitis: POSITIVE — AB

## 2018-10-02 ENCOUNTER — Encounter: Payer: Medicaid Other | Admitting: Obstetrics & Gynecology

## 2018-10-02 ENCOUNTER — Telehealth: Payer: Self-pay

## 2018-10-02 DIAGNOSIS — B9689 Other specified bacterial agents as the cause of diseases classified elsewhere: Secondary | ICD-10-CM

## 2018-10-02 DIAGNOSIS — N760 Acute vaginitis: Secondary | ICD-10-CM

## 2018-10-02 MED ORDER — METRONIDAZOLE 500 MG PO TABS: 500.0000 mg | ORAL_TABLET | Freq: Two times a day (BID) | ORAL | 0 refills | Status: DC

## 2018-10-02 NOTE — Telephone Encounter (Signed)
Called pt about positive BV and yeast results. Understanding was voiced. Medication was sent to the pharmacy.  Thane Age l Minahil Quinlivan, CMA

## 2018-10-15 DIAGNOSIS — F319 Bipolar disorder, unspecified: Secondary | ICD-10-CM | POA: Diagnosis not present

## 2018-10-28 ENCOUNTER — Encounter: Payer: Self-pay | Admitting: Advanced Practice Midwife

## 2018-10-28 ENCOUNTER — Ambulatory Visit (INDEPENDENT_AMBULATORY_CARE_PROVIDER_SITE_OTHER): Payer: Medicaid Other | Admitting: Advanced Practice Midwife

## 2018-10-28 ENCOUNTER — Other Ambulatory Visit: Payer: Self-pay

## 2018-10-28 VITALS — BP 98/71 | HR 101 | Wt 163.0 lb

## 2018-10-28 DIAGNOSIS — Z3A28 28 weeks gestation of pregnancy: Secondary | ICD-10-CM

## 2018-10-28 DIAGNOSIS — Z3400 Encounter for supervision of normal first pregnancy, unspecified trimester: Secondary | ICD-10-CM

## 2018-10-28 DIAGNOSIS — O26893 Other specified pregnancy related conditions, third trimester: Secondary | ICD-10-CM

## 2018-10-28 DIAGNOSIS — N898 Other specified noninflammatory disorders of vagina: Secondary | ICD-10-CM

## 2018-10-28 DIAGNOSIS — Z87440 Personal history of urinary (tract) infections: Secondary | ICD-10-CM

## 2018-10-28 MED ORDER — FLUCONAZOLE 150 MG PO TABS: 150.0000 mg | ORAL_TABLET | Freq: Once | ORAL | 0 refills | Status: AC

## 2018-10-28 NOTE — Patient Instructions (Signed)
Glucose Tolerance Test During Pregnancy Why am I having this test? The glucose tolerance test (GTT) is done to check how your body processes sugar (glucose). This is one of several tests used to diagnose diabetes that develops during pregnancy (gestational diabetes mellitus). Gestational diabetes is a temporary form of diabetes that some women develop during pregnancy. It usually occurs during the second trimester of pregnancy and goes away after delivery. Testing (screening) for gestational diabetes usually occurs between 24 and 28 weeks of pregnancy. You may have the GTT test after having a 1-hour glucose screening test if the results from that test indicate that you may have gestational diabetes. You may also have this test if:  You have a history of gestational diabetes.  You have a history of giving birth to very large babies or have experienced repeated fetal loss (stillbirth).  You have signs and symptoms of diabetes, such as: ? Changes in your vision. ? Tingling or numbness in your hands or feet. ? Changes in hunger, thirst, and urination that are not otherwise explained by your pregnancy. What is being tested? This test measures the amount of glucose in your blood at different times during a period of 3 hours. This indicates how well your body is able to process glucose. What kind of sample is taken?  Blood samples are required for this test. They are usually collected by inserting a needle into a blood vessel. How do I prepare for this test?  For 3 days before your test, eat normally. Have plenty of carbohydrate-rich foods.  Follow instructions from your health care provider about: ? Eating or drinking restrictions on the day of the test. You may be asked to not eat or drink anything other than water (fast) starting 8-10 hours before the test. ? Changing or stopping your regular medicines. Some medicines may interfere with this test. Tell a health care provider about:  All  medicines you are taking, including vitamins, herbs, eye drops, creams, and over-the-counter medicines.  Any blood disorders you have.  Any surgeries you have had.  Any medical conditions you have. What happens during the test? First, your blood glucose will be measured. This is referred to as your fasting blood glucose, since you fasted before the test. Then, you will drink a glucose solution that contains a certain amount of glucose. Your blood glucose will be measured again 1, 2, and 3 hours after drinking the solution. This test takes about 3 hours to complete. You will need to stay at the testing location during this time. During the testing period:  Do not eat or drink anything other than the glucose solution.  Do not exercise.  Do not use any products that contain nicotine or tobacco, such as cigarettes and e-cigarettes. If you need help stopping, ask your health care provider. The testing procedure may vary among health care providers and hospitals. How are the results reported? Your results will be reported as milligrams of glucose per deciliter of blood (mg/dL) or millimoles per liter (mmol/L). Your health care provider will compare your results to normal ranges that were established after testing a large group of people (reference ranges). Reference ranges may vary among labs and hospitals. For this test, common reference ranges are:  Fasting: less than 95-105 mg/dL (5.3-5.8 mmol/L).  1 hour after drinking glucose: less than 180-190 mg/dL (10.0-10.5 mmol/L).  2 hours after drinking glucose: less than 155-165 mg/dL (8.6-9.2 mmol/L).  3 hours after drinking glucose: 140-145 mg/dL (7.8-8.1 mmol/L). What do the   results mean? Results within reference ranges are considered normal, meaning that your glucose levels are well-controlled. If two or more of your blood glucose levels are high, you may be diagnosed with gestational diabetes. If only one level is high, your health care  provider may suggest repeat testing or other tests to confirm a diagnosis. Talk with your health care provider about what your results mean. Questions to ask your health care provider Ask your health care provider, or the department that is doing the test:  When will my results be ready?  How will I get my results?  What are my treatment options?  What other tests do I need?  What are my next steps? Summary  The glucose tolerance test (GTT) is one of several tests used to diagnose diabetes that develops during pregnancy (gestational diabetes mellitus). Gestational diabetes is a temporary form of diabetes that some women develop during pregnancy.  You may have the GTT test after having a 1-hour glucose screening test if the results from that test indicate that you may have gestational diabetes. You may also have this test if you have any symptoms or risk factors for gestational diabetes.  Talk with your health care provider about what your results mean. This information is not intended to replace advice given to you by your health care provider. Make sure you discuss any questions you have with your health care provider. Document Released: 10/02/2011 Document Revised: 07/24/2018 Document Reviewed: 11/12/2016 Elsevier Patient Education  2020 Elsevier Inc.  

## 2018-10-28 NOTE — Progress Notes (Signed)
   PRENATAL VISIT NOTE  Subjective:  Kelsey Koch is a 23 y.o. G1P0 at [redacted]w[redacted]d being seen today for ongoing prenatal care.  She is currently monitored for the following issues for this low-risk pregnancy and has Supervision of normal first pregnancy, antepartum and Vaginal itching on their problem list.  Patient reports intermittent episodes of vaginal fluid leaking, requires pad x 1 month.  Contractions: Not present. Vag. Bleeding: None.  Movement: Present. Denies leaking of fluid.   The following portions of the patient's history were reviewed and updated as appropriate: allergies, current medications, past family history, past medical history, past social history, past surgical history and problem list.   Objective:   Vitals:   10/28/18 1055  BP: 98/71  Pulse: (!) 101  Weight: 73.9 kg    Fetal Status: Fetal Heart Rate (bpm): 140   Movement: Present     General:  Alert, oriented and cooperative. Patient is in no acute distress.  Skin: Skin is warm and dry. No rash noted.   Cardiovascular: Normal heart rate noted  Respiratory: Normal respiratory effort, no problems with respiration noted  Abdomen: Soft, gravid, appropriate for gestational age.  Pain/Pressure: Present     Pelvic: Cervical exam performed       Sterile speculum exam done, no pooling, no ferning.  Lots of yeast in vagina.  Cervix appears closed  Extremities: Normal range of motion.  Edema: Trace  Mental Status: Normal mood and affect. Normal behavior. Normal judgment and thought content.   Assessment and Plan:  Pregnancy: G1P0 at [redacted]w[redacted]d 1. Supervision of normal first pregnancy, antepartum  - CBC - Glucose Tolerance, 2 Hours w/1 Hour - HIV Antibody (routine testing w rflx) - RPR - Urine Culture  2.   Vaginal discharge in pregnancy     Negative exam for pooling     Copious yeast     Will treat with Diflucan     Will check urine culture, could be leaking urine, has hx multiple UTIs  Preterm labor symptoms and  general obstetric precautions including but not limited to vaginal bleeding, contractions, leaking of fluid and fetal movement were reviewed in detail with the patient. Please refer to After Visit Summary for other counseling recommendations.   Return in about 2 weeks (around 11/11/2018) for [facility].  Future Appointments  Date Time Provider Cassandra  10/29/2018  9:00 AM [facility] NURSE [facility] None    Hansel Feinstein, CNM

## 2018-10-29 ENCOUNTER — Other Ambulatory Visit: Payer: Medicaid Other

## 2018-10-29 ENCOUNTER — Other Ambulatory Visit: Payer: Self-pay

## 2018-10-29 DIAGNOSIS — F319 Bipolar disorder, unspecified: Secondary | ICD-10-CM | POA: Diagnosis not present

## 2018-10-29 DIAGNOSIS — Z3400 Encounter for supervision of normal first pregnancy, unspecified trimester: Secondary | ICD-10-CM | POA: Diagnosis not present

## 2018-10-29 NOTE — Progress Notes (Signed)
Patient sent to lab for blood draw. Jennifer Howard RN 

## 2018-10-30 LAB — CBC
Hematocrit: 33.5 % — ABNORMAL LOW (ref 34.0–46.6)
Hemoglobin: 11.2 g/dL (ref 11.1–15.9)
MCH: 31 pg (ref 26.6–33.0)
MCHC: 33.4 g/dL (ref 31.5–35.7)
MCV: 93 fL (ref 79–97)
Platelets: 294 10*3/uL (ref 150–450)
RBC: 3.61 x10E6/uL — ABNORMAL LOW (ref 3.77–5.28)
RDW: 12.2 % (ref 11.7–15.4)
WBC: 10.2 10*3/uL (ref 3.4–10.8)

## 2018-10-30 LAB — RPR: RPR Ser Ql: NONREACTIVE

## 2018-10-30 LAB — GLUCOSE TOLERANCE, 2 HOURS W/ 1HR
Glucose, 1 hour: 106 mg/dL (ref 65–179)
Glucose, 2 hour: 95 mg/dL (ref 65–152)
Glucose, Fasting: 81 mg/dL (ref 65–91)

## 2018-10-30 LAB — URINE CULTURE

## 2018-10-30 LAB — HIV ANTIBODY (ROUTINE TESTING W REFLEX): HIV Screen 4th Generation wRfx: NONREACTIVE

## 2018-11-04 ENCOUNTER — Telehealth: Payer: Self-pay

## 2018-11-04 DIAGNOSIS — F3132 Bipolar disorder, current episode depressed, moderate: Secondary | ICD-10-CM | POA: Diagnosis not present

## 2018-11-04 NOTE — Telephone Encounter (Signed)
Patient left message about meds. Kathrene Alu RN

## 2018-11-04 NOTE — Telephone Encounter (Signed)
Patient was prescribed lamotrigine 25mg   (for bipolar) by her physiatrist  (Through [facility]).  Patient is [redacted] weeks pregnant. Patient just wanting to know if this medication is ok .Kathrene Alu RN

## 2018-11-06 NOTE — Telephone Encounter (Signed)
Left message for patient to return call back to [place].  Dr. Ihor Dow confirms she can take the medication prescribed by her psychiatrist for her bipolar. Kathrene Alu RN

## 2018-11-10 DIAGNOSIS — F319 Bipolar disorder, unspecified: Secondary | ICD-10-CM | POA: Diagnosis not present

## 2018-11-11 ENCOUNTER — Encounter: Payer: Self-pay | Admitting: Advanced Practice Midwife

## 2018-11-11 ENCOUNTER — Other Ambulatory Visit: Payer: Self-pay

## 2018-11-11 ENCOUNTER — Ambulatory Visit (INDEPENDENT_AMBULATORY_CARE_PROVIDER_SITE_OTHER): Payer: Medicaid Other | Admitting: Advanced Practice Midwife

## 2018-11-11 VITALS — Wt 164.0 lb

## 2018-11-11 DIAGNOSIS — Z3400 Encounter for supervision of normal first pregnancy, unspecified trimester: Secondary | ICD-10-CM

## 2018-11-11 DIAGNOSIS — Z3403 Encounter for supervision of normal first pregnancy, third trimester: Secondary | ICD-10-CM

## 2018-11-11 DIAGNOSIS — Z3A30 30 weeks gestation of pregnancy: Secondary | ICD-10-CM

## 2018-11-11 NOTE — Progress Notes (Signed)
   TELEHEALTH OBSTETRICS PRENATAL VIRTUAL VIDEO VISIT ENCOUNTER NOTE  Provider location: [facility] [place]   I connected with Kelsey Koch on 11/11/18 at  9:45 AM EDT by WebEx Video Encounter at home and verified that I am speaking with the correct person using two identifiers.   I discussed the limitations, risks, security and privacy concerns of performing an evaluation and management service virtually and the availability of in person appointments. I also discussed with the patient that there may be a patient responsible charge related to this service. The patient expressed understanding and agreed to proceed.  Subjective:  Kelsey Koch is a 23 y.o. G1P0 at [redacted]w[redacted]d being seen today for ongoing prenatal care.  She is currently monitored for the following issues for this low-risk pregnancy and has Supervision of normal first pregnancy, antepartum and Vaginal itching on their problem list.  Patient reports "tired and heavy".  Contractions: Not present. Vag. Bleeding: None.  Movement: Present. Denies any leaking of fluid.   The following portions of the patient's history were reviewed and updated as appropriate: allergies, current medications, past family history, past medical history, past social history, past surgical history and problem list.   Objective:   Vitals:   11/11/18 1005  Weight: 74.4 kg    Fetal Status:     Movement: Present     General:  Alert, oriented and cooperative. Patient is in no acute distress.  Respiratory: Normal respiratory effort, no problems with respiration noted  Mental Status: Normal mood and affect. Normal behavior. Normal judgment and thought content.  Rest of physical exam deferred due to type of encounter  Imaging: No results found.  Assessment and Plan:  Pregnancy: G1P0 at [redacted]w[redacted]d   Reviewed lab results and normal glucola    Reviewed normal FM patterns and BH contractions . Preterm labor symptoms and  general obstetric precautions including but not limited to vaginal bleeding, contractions, leaking of fluid and fetal movement were reviewed in detail with the patient. I discussed the assessment and treatment plan with the patient. The patient was provided an opportunity to ask questions and all were answered. The patient agreed with the plan and demonstrated an understanding of the instructions. The patient was advised to call back or seek an in-person office evaluation/go to MAU at [facility] for any urgent or concerning symptoms. Please refer to After Visit Summary for other counseling recommendations.   I provided 10 minutes of face-to-face time during this encounter.  Next telehealth visit 2 wks  Hansel Feinstein, [facility] for [employer], [place]

## 2018-11-11 NOTE — Patient Instructions (Signed)

## 2018-11-23 ENCOUNTER — Inpatient Hospital Stay (HOSPITAL_COMMUNITY)
Admission: AD | Admit: 2018-11-23 | Discharge: 2018-11-23 | Disposition: A | Discharge status: Home / Self Care | Payer: Medicaid Other | Attending: Family Medicine | Admitting: Family Medicine

## 2018-11-23 ENCOUNTER — Other Ambulatory Visit: Payer: Self-pay

## 2018-11-23 ENCOUNTER — Encounter (HOSPITAL_COMMUNITY): Payer: Self-pay | Admitting: *Deleted

## 2018-11-23 DIAGNOSIS — O26899 Other specified pregnancy related conditions, unspecified trimester: Secondary | ICD-10-CM

## 2018-11-23 DIAGNOSIS — Z3A32 32 weeks gestation of pregnancy: Secondary | ICD-10-CM | POA: Insufficient documentation

## 2018-11-23 DIAGNOSIS — R109 Unspecified abdominal pain: Secondary | ICD-10-CM | POA: Diagnosis not present

## 2018-11-23 DIAGNOSIS — F319 Bipolar disorder, unspecified: Secondary | ICD-10-CM | POA: Insufficient documentation

## 2018-11-23 DIAGNOSIS — Z87891 Personal history of nicotine dependence: Secondary | ICD-10-CM | POA: Diagnosis not present

## 2018-11-23 DIAGNOSIS — O26893 Other specified pregnancy related conditions, third trimester: Secondary | ICD-10-CM | POA: Insufficient documentation

## 2018-11-23 HISTORY — DX: Other specified health status: Z78.9

## 2018-11-23 LAB — URINALYSIS, ROUTINE W REFLEX MICROSCOPIC
Bilirubin Urine: NEGATIVE
Glucose, UA: NEGATIVE mg/dL
Hgb urine dipstick: NEGATIVE
Ketones, ur: NEGATIVE mg/dL
Leukocytes,Ua: NEGATIVE
Nitrite: NEGATIVE
Protein, ur: NEGATIVE mg/dL
Specific Gravity, Urine: 1.01 (ref 1.005–1.030)
pH: 7 (ref 5.0–8.0)

## 2018-11-23 LAB — WET PREP, GENITAL
Clue Cells Wet Prep HPF POC: NONE SEEN
Sperm: NONE SEEN
Trich, Wet Prep: NONE SEEN
Yeast Wet Prep HPF POC: NONE SEEN

## 2018-11-23 NOTE — Discharge Instructions (Signed)
Abdominal Pain During Pregnancy ° °Abdominal pain is common during pregnancy, and has many possible causes. Some causes are more serious than others, and sometimes the cause is not known. Abdominal pain can be a sign that labor is starting. It can also be caused by normal growth and stretching of muscles and ligaments during pregnancy. Always tell your health care provider if you have any abdominal pain. °Follow these instructions at home: °· Do not have sex or put anything in your vagina until your pain goes away completely. °· Get plenty of rest until your pain improves. °· Drink enough fluid to keep your urine pale yellow. °· Take over-the-counter and prescription medicines only as told by your health care provider. °· Keep all follow-up visits as told by your health care provider. This is important. °Contact a health care provider if: °· Your pain continues or gets worse after resting. °· You have lower abdominal pain that: °? Comes and goes at regular intervals. °? Spreads to your back. °? Is similar to menstrual cramps. °· You have pain or burning when you urinate. °Get help right away if: °· You have a fever or chills. °· You have vaginal bleeding. °· You are leaking fluid from your vagina. °· You are passing tissue from your vagina. °· You have vomiting or diarrhea that lasts for more than 24 hours. °· Your baby is moving less than usual. °· You feel very weak or faint. °· You have shortness of breath. °· You develop severe pain in your upper abdomen. °Summary °· Abdominal pain is common during pregnancy, and has many possible causes. °· If you experience abdominal pain during pregnancy, tell your health care provider right away. °· Follow your health care provider's home care instructions and keep all follow-up visits as directed. °This information is not intended to replace advice given to you by your health care provider. Make sure you discuss any questions you have with your health care  provider. °Document Released: 04/02/2005 Document Revised: 07/21/2018 Document Reviewed: 07/05/2016 °Elsevier Patient Education © 2020 Elsevier Inc. ° °

## 2018-11-23 NOTE — MAU Provider Note (Signed)
History     CSN: [id number]  Arrival date and time: 11/23/18 0440   First Provider Initiated Contact with Patient 11/23/18 [phone]      Chief Complaint  Patient presents with  . Contractions   Kelsey Koch is a 23 y.o. G1P0 at [redacted]w[redacted]d who receives care at [facility].  She presents today for Contractions.  She states she has been experiencing period type cramps for the last 2 days that were infrequent, but become more frequent over the last 5 hours. Patient reports that the pain starts in her lower abdomen and "feels like the baby is balling up" and then radiates to her back.  She rates the pain a 2/10 and reports "it's really bad when I'm laying down," but remains present despite position. Patient denies relieving factors and states she has not taken anything for pain.  Patient endorses fetal movement that "is more aggressive, but not as frequent."  Patient reports "everyday discharge," that is a "loose thin white."  Patient denies bleeding or leaking.  Patient reports recent treatment of BV and yeast in the last month, but states she completed the medication and has had no symptoms. She reports drinking 3 bottles of water today and goes on to state that this is low for her as she usually drinks between 5-7.  Patient denies recent sexual activity.      OB History    Gravida  1   Para      Term      Preterm      AB      Living        SAB      TAB      Ectopic      Multiple      Live Births              Past Medical History:  Diagnosis Date  . Medical history non-contributory     Past Surgical History:  Procedure Laterality Date  . NO PAST SURGERIES      Family History  Problem Relation Age of Onset  . Ovarian cysts Mother     Social History   Tobacco Use  . Smoking status: Former Smoker    Packs/day: 0.25    Years: 3.00    Pack years: 0.75    Types: Cigarettes    Quit date: 02/2018    Years since quitting: 0.7  . Smokeless tobacco: Never Used   Substance Use Topics  . Alcohol use: Not Currently    Alcohol/week: 2.0 standard drinks    Types: 2 Glasses of wine per week    Frequency: Never  . Drug use: Never    Allergies:  Allergies  Allergen Reactions  . Ethinyl Estradiol   . Kiwi Extract   . Levonorgestrel   . Onion     Medications Prior to Admission  Medication Sig Dispense Refill Last Dose  . lamoTRIgine (LAMICTAL) 25 MG tablet Take 25 mg by mouth daily.   11/22/2018 at Unknown time  . Prenatal Vit-Fe Fumarate-FA (PRENATAL VITAMINS PO) Take by mouth.   11/22/2018 at Unknown time  . metroNIDAZOLE (FLAGYL) 500 MG tablet Take 1 tablet (500 mg total) by mouth 2 (two) times daily. 14 tablet 0   . promethazine (PHENERGAN) 25 MG tablet Take 25 mg by mouth every 6 (six) hours as needed for nausea or vomiting.   More than a month at Unknown time  . terconazole (TERAZOL 7) 0.4 % vaginal cream Place 1 applicator  vaginally at bedtime. (Patient not taking: Reported on 11/11/2018) 45 g 0     Review of Systems  Constitutional: Negative for chills and fever.  Respiratory: Negative for cough and shortness of breath.   Gastrointestinal: Positive for constipation (BM last night "kind of" hard to pass.) and nausea (Started tonight with pain. ). Negative for diarrhea and vomiting.  Genitourinary: Positive for vaginal discharge. Negative for difficulty urinating, dysuria and vaginal bleeding.  Neurological: Positive for light-headedness (Nightly "for the last few days."). Negative for dizziness and headaches.   Physical Exam   Blood pressure 120/65, pulse (!) 106, temperature 98.8 F (37.1 C), temperature source Oral, resp. rate 17, weight 79.6 kg, last menstrual period 04/11/2018.  Physical Exam  Constitutional: She is oriented to person, place, and time. She appears well-developed and well-nourished. No distress.  HENT:  Head: Normocephalic and atraumatic.  Eyes: Conjunctivae are normal.  Neck: Normal range of motion.  Cardiovascular:  Normal rate.  Respiratory: Effort normal.  Musculoskeletal: Normal range of motion.  Neurological: She is alert and oriented to person, place, and time.  Skin: Skin is warm and dry.  Psychiatric: She has a normal mood and affect. Her behavior is normal.    Fetal Assessment 125 bpm, Mod Var, -Decels, +Accels Toco: None graphed  MAU Course  No results found for this or any previous visit (from the past 24 hour(s)). No results found.  MDM PE Labs:UA, GC/CT, Wet Prep EFM  Assessment and Plan  23 year old G1P0  SIUP at 32.2weeks Cat I FT Abdominal Cramping  -Exam findings discussed. -Informed of closed cervix and vaginal discharge c/w recent treatment, but not significant for yeast or BV.  -Cultures collected. -Patient offered and declined pain medication. -Will give fluids. -Discussed increased hydration, usage of belly support band, and frequent rest.  -NST reactive. -Will await results.  Cherre RobinsJessica L Eurika Sandy MSN, CNM 11/23/2018, 5:14 AM    Reassessment (5:59 AM) 130 bpm, Mod Var, -Decels, +Accels Toco: Irregular  -Wet prep returns with insignificant findings. -Results discussed with patient. -Informed that GC/CT will return within 2-3 days. -Review of Toco shows some mild contractions not present earlier. Patient endorses perception stating pain is like cramping.  -None perceived while provider in room.  -Patient offered and declines medication. -[facility] and informed possibly, but should continue to monitor.  -Instructed to keep appt as scheduled and inform office if symptoms continue Wednesday August 12,2020 -Encouraged to call or return to MAU if symptoms worsen or with the onset of new symptoms. -Discharged to home in stable condition.  Cherre RobinsJessica L Michol Emory MSN, CNM

## 2018-11-23 NOTE — MAU Note (Addendum)
Pt reports to MAU c/o ctxs that have been ongoing tonight.pt states the pain started around 2200 and has been as close as 21min apart. Pt reports the ctx pain is now about every 32min but she was concerned. +fm but pt reports it has been decreased the last few days. No bleeding or lof. Pt reports some nausea.

## 2018-11-24 DIAGNOSIS — F319 Bipolar disorder, unspecified: Secondary | ICD-10-CM | POA: Diagnosis not present

## 2018-11-25 LAB — GC/CHLAMYDIA PROBE AMP (~~LOC~~) NOT AT ARMC
Chlamydia: NEGATIVE
Neisseria Gonorrhea: NEGATIVE

## 2018-11-26 ENCOUNTER — Encounter: Payer: Self-pay | Admitting: Medical

## 2018-11-26 ENCOUNTER — Ambulatory Visit (INDEPENDENT_AMBULATORY_CARE_PROVIDER_SITE_OTHER): Payer: Medicaid Other | Admitting: Medical

## 2018-11-26 VITALS — BP 125/79 | Wt 175.0 lb

## 2018-11-26 DIAGNOSIS — Z3400 Encounter for supervision of normal first pregnancy, unspecified trimester: Secondary | ICD-10-CM

## 2018-11-26 DIAGNOSIS — Z3A32 32 weeks gestation of pregnancy: Secondary | ICD-10-CM

## 2018-11-26 DIAGNOSIS — Z3403 Encounter for supervision of normal first pregnancy, third trimester: Secondary | ICD-10-CM

## 2018-11-26 NOTE — Progress Notes (Signed)
I connected with Kelsey Koch on 11/26/18 at  1:15 PM EDT by: WebEx and verified that I am speaking with the correct person using two identifiers.  Patient is located at home and provider is located at [place].     The purpose of this virtual visit is to provide medical care while limiting exposure to the novel coronavirus. I discussed the limitations, risks, security and privacy concerns of performing an evaluation and management service by WebEx and the availability of in person appointments. I also discussed with the patient that there may be a patient responsible charge related to this service. By engaging in this virtual visit, you consent to the provision of healthcare.  Additionally, you authorize for your insurance to be billed for the services provided during this visit.  The patient expressed understanding and agreed to proceed.  The following staff members participated in the virtual visit:  Wendelyn Breslow, CMA    PRENATAL VISIT NOTE  Subjective:  Kelsey Koch is a 23 y.o. G1P0 at [redacted]w[redacted]d  for phone visit for ongoing prenatal care.  She is currently monitored for the following issues for this low-risk pregnancy and has Supervision of normal first pregnancy, antepartum; Vaginal itching; and Bipolar disorder (manic depression) ([place]) on their problem list.  Patient reports Pelvic pressure. Patient was evaluated in MAU on 11/23/18. No contractions noted on TOCO and cervix was closed.  Contractions: Irregular. Vag. Bleeding: None.  Movement: Present. Denies leaking of fluid.   The following portions of the patient's history were reviewed and updated as appropriate: allergies, current medications, past family history, past medical history, past social history, past surgical history and problem list.   Objective:   Vitals:   11/26/18 1306  BP: 125/79  Weight: 175 lb (79.4 kg)   Self-Obtained  Fetal Status:     Movement: Present     Assessment and Plan:  Pregnancy: G1P0 at [redacted]w[redacted]d  1. Supervision of normal first pregnancy, antepartum - Doing well - Discussed options for managing pelvic pressure including pregnancy belt and hydrotherapy  Preterm labor symptoms and general obstetric precautions including but not limited to vaginal bleeding, contractions, leaking of fluid and fetal movement were reviewed in detail with the patient.  Return in about 4 weeks (around 12/24/2018) for LOB, In-Person.  No future appointments.   Time spent on virtual visit: 10 minutes  Kerry Hough, PA-C

## 2018-11-26 NOTE — Patient Instructions (Signed)
Fetal Movement Counts Patient Name: ________________________________________________ Patient Due Date: ____________________ What is a fetal movement count?  A fetal movement count is the number of times that you feel your baby move during a certain amount of time. This may also be called a fetal kick count. A fetal movement count is recommended for every pregnant woman. You may be asked to start counting fetal movements as early as week 28 of your pregnancy. Pay attention to when your baby is most active. You may notice your baby's sleep and wake cycles. You may also notice things that make your baby move more. You should do a fetal movement count:  When your baby is normally most active.  At the same time each day. A good time to count movements is while you are resting, after having something to eat and drink. How do I count fetal movements? 1. Find a quiet, comfortable area. Sit, or lie down on your side. 2. Write down the date, the start time and stop time, and the number of movements that you felt between those two times. Take this information with you to your health care visits. 3. For 2 hours, count kicks, flutters, swishes, rolls, and jabs. You should feel at least 10 movements during 2 hours. 4. You may stop counting after you have felt 10 movements. 5. If you do not feel 10 movements in 2 hours, have something to eat and drink. Then, keep resting and counting for 1 hour. If you feel at least 4 movements during that hour, you may stop counting. Contact a health care provider if:  You feel fewer than 4 movements in 2 hours.  Your baby is not moving like he or she usually does. Date: ____________ Start time: ____________ Stop time: ____________ Movements: ____________ Date: ____________ Start time: ____________ Stop time: ____________ Movements: ____________ Date: ____________ Start time: ____________ Stop time: ____________ Movements: ____________ Date: ____________ Start time:  ____________ Stop time: ____________ Movements: ____________ Date: ____________ Start time: ____________ Stop time: ____________ Movements: ____________ Date: ____________ Start time: ____________ Stop time: ____________ Movements: ____________ Date: ____________ Start time: ____________ Stop time: ____________ Movements: ____________ Date: ____________ Start time: ____________ Stop time: ____________ Movements: ____________ Date: ____________ Start time: ____________ Stop time: ____________ Movements: ____________ This information is not intended to replace advice given to you by your health care provider. Make sure you discuss any questions you have with your health care provider. Document Released: 05/02/2006 Document Revised: 04/22/2018 Document Reviewed: 05/12/2015 Elsevier Patient Education  2020 [employer] Braxton Hicks Contractions Contractions of the uterus can occur throughout pregnancy, but they are not always a sign that you are in labor. You may have practice contractions called Braxton Hicks contractions. These false labor contractions are sometimes confused with true labor. What are Braxton Hicks contractions? Braxton Hicks contractions are tightening movements that occur in the muscles of the uterus before labor. Unlike true labor contractions, these contractions do not result in opening (dilation) and thinning of the cervix. Toward the end of pregnancy (32-34 weeks), Braxton Hicks contractions can happen more often and may become stronger. These contractions are sometimes difficult to tell apart from true labor because they can be very uncomfortable. You should not feel embarrassed if you go to the hospital with false labor. Sometimes, the only way to tell if you are in true labor is for your health care provider to look for changes in the cervix. The health care provider will do a physical exam and may monitor your contractions. If you   are not in true labor, the exam should show  that your cervix is not dilating and your water has not broken. If there are no other health problems associated with your pregnancy, it is completely safe for you to be sent home with false labor. You may continue to have Braxton Hicks contractions until you go into true labor. How to tell the difference between true labor and false labor True labor  Contractions last 30-70 seconds.  Contractions become very regular.  Discomfort is usually felt in the top of the uterus, and it spreads to the lower abdomen and low back.  Contractions do not go away with walking.  Contractions usually become more intense and increase in frequency.  The cervix dilates and gets thinner. False labor  Contractions are usually shorter and not as strong as true labor contractions.  Contractions are usually irregular.  Contractions are often felt in the front of the lower abdomen and in the groin.  Contractions may go away when you walk around or change positions while lying down.  Contractions get weaker and are shorter-lasting as time goes on.  The cervix usually does not dilate or become thin. Follow these instructions at home:   Take over-the-counter and prescription medicines only as told by your health care provider.  Keep up with your usual exercises and follow other instructions from your health care provider.  Eat and drink lightly if you think you are going into labor.  If Braxton Hicks contractions are making you uncomfortable: ? Change your position from lying down or resting to walking, or change from walking to resting. ? Sit and rest in a tub of warm water. ? Drink enough fluid to keep your urine pale yellow. Dehydration may cause these contractions. ? Do slow and deep breathing several times an hour.  Keep all follow-up prenatal visits as told by your health care provider. This is important. Contact a health care provider if:  You have a fever.  You have continuous pain in  your abdomen. Get help right away if:  Your contractions become stronger, more regular, and closer together.  You have fluid leaking or gushing from your vagina.  You pass blood-tinged mucus (bloody show).  You have bleeding from your vagina.  You have low back pain that you never had before.  You feel your baby's head pushing down and causing pelvic pressure.  Your baby is not moving inside you as much as it used to. Summary  Contractions that occur before labor are called Braxton Hicks contractions, false labor, or practice contractions.  Braxton Hicks contractions are usually shorter, weaker, farther apart, and less regular than true labor contractions. True labor contractions usually become progressively stronger and regular, and they become more frequent.  Manage discomfort from Braxton Hicks contractions by changing position, resting in a warm bath, drinking plenty of water, or practicing deep breathing. This information is not intended to replace advice given to you by your health care provider. Make sure you discuss any questions you have with your health care provider. Document Released: 08/16/2016 Document Revised: 03/15/2017 Document Reviewed: 08/16/2016 Elsevier Patient Education  2020 [employer] Inc[employer]  

## 2018-12-01 DIAGNOSIS — F3132 Bipolar disorder, current episode depressed, moderate: Secondary | ICD-10-CM | POA: Diagnosis not present

## 2018-12-24 ENCOUNTER — Other Ambulatory Visit (HOSPITAL_COMMUNITY)
Admission: RE | Admit: 2018-12-24 | Discharge: 2018-12-24 | Disposition: A | Discharge status: Home / Self Care | Payer: Medicaid Other | Source: Ambulatory Visit | Attending: Family Medicine | Admitting: Family Medicine

## 2018-12-24 ENCOUNTER — Ambulatory Visit (INDEPENDENT_AMBULATORY_CARE_PROVIDER_SITE_OTHER): Payer: Medicaid Other | Admitting: Family Medicine

## 2018-12-24 ENCOUNTER — Other Ambulatory Visit: Payer: Self-pay

## 2018-12-24 DIAGNOSIS — Z3400 Encounter for supervision of normal first pregnancy, unspecified trimester: Secondary | ICD-10-CM | POA: Insufficient documentation

## 2018-12-24 DIAGNOSIS — Z3403 Encounter for supervision of normal first pregnancy, third trimester: Secondary | ICD-10-CM

## 2018-12-24 DIAGNOSIS — Z3A36 36 weeks gestation of pregnancy: Secondary | ICD-10-CM

## 2018-12-24 NOTE — Progress Notes (Signed)
   PRENATAL VISIT NOTE  Subjective:  Kelsey Koch is a 23 y.o. G1P0 at [redacted]w[redacted]d being seen today for ongoing prenatal care.  She is currently monitored for the following issues for this low-risk pregnancy and has Supervision of normal first pregnancy, antepartum; Vaginal itching; and Bipolar disorder (manic depression) ([place]) on their problem list.  Patient reports occasional contractions.  Contractions: Irregular. Vag. Bleeding: None.  Movement: Present. Denies leaking of fluid.   The following portions of the patient's history were reviewed and updated as appropriate: allergies, current medications, past family history, past medical history, past social history, past surgical history and problem list.   Objective:   Vitals:   12/24/18 1330  BP: 110/89  Pulse: 95  Weight: 186 lb 1.9 oz (84.4 kg)    Fetal Status: Fetal Heart Rate (bpm): 130 Fundal Height: 36 cm Movement: Present  Presentation: Vertex  General:  Alert, oriented and cooperative. Patient is in no acute distress.  Skin: Skin is warm and dry. No rash noted.   Cardiovascular: Normal heart rate noted  Respiratory: Normal respiratory effort, no problems with respiration noted  Abdomen: Soft, gravid, appropriate for gestational age.  Pain/Pressure: Present     Pelvic: Cervical exam deferred Dilation: Fingertip   Station: -3  Extremities: Normal range of motion.  Edema: Trace  Mental Status: Normal mood and affect. Normal behavior. Normal judgment and thought content.   Assessment and Plan:  Pregnancy: G1P0 at [redacted]w[redacted]d 1. Supervision of normal first pregnancy, antepartum FHT and FH normal - Culture, beta strep (group b only) - GC/Chlamydia probe amp ([place])not at [facility]  Preterm labor symptoms and general obstetric precautions including but not limited to vaginal bleeding, contractions, leaking of fluid and fetal movement were reviewed in detail with the patient. Please refer to After Visit Summary for other counseling  recommendations.   Return in about 1 week (around 12/31/2018) for OB f/u.  No future appointments.  Truett Mainland, DO

## 2018-12-25 LAB — GC/CHLAMYDIA PROBE AMP (~~LOC~~) NOT AT ARMC
Chlamydia: NEGATIVE
Neisseria Gonorrhea: NEGATIVE

## 2018-12-28 LAB — CULTURE, BETA STREP (GROUP B ONLY): Strep Gp B Culture: NEGATIVE

## 2019-01-01 ENCOUNTER — Other Ambulatory Visit: Payer: Self-pay

## 2019-01-01 ENCOUNTER — Ambulatory Visit (INDEPENDENT_AMBULATORY_CARE_PROVIDER_SITE_OTHER): Payer: Medicaid Other | Admitting: Family Medicine

## 2019-01-01 VITALS — BP 104/89 | HR 88 | Wt 187.0 lb

## 2019-01-01 DIAGNOSIS — Z3403 Encounter for supervision of normal first pregnancy, third trimester: Secondary | ICD-10-CM

## 2019-01-01 DIAGNOSIS — Z3A37 37 weeks gestation of pregnancy: Secondary | ICD-10-CM

## 2019-01-01 DIAGNOSIS — Z3400 Encounter for supervision of normal first pregnancy, unspecified trimester: Secondary | ICD-10-CM

## 2019-01-01 NOTE — Progress Notes (Signed)
   PRENATAL VISIT NOTE  Subjective:  Kelsey Koch is a 23 y.o. G1P0 at [redacted]w[redacted]d being seen today for ongoing prenatal care.  She is currently monitored for the following issues for this low-risk pregnancy and has Supervision of normal first pregnancy, antepartum; Vaginal itching; and Bipolar disorder (manic depression) ([place]) on their problem list.  Patient reports no complaints.  Contractions: Irritability. Vag. Bleeding: None.  Movement: Present. Denies leaking of fluid.   The following portions of the patient's history were reviewed and updated as appropriate: allergies, current medications, past family history, past medical history, past social history, past surgical history and problem list.   Objective:   Vitals:   01/01/19 1418  BP: 104/89  Pulse: 88  Weight: 187 lb (84.8 kg)    Fetal Status: Fetal Heart Rate (bpm): 130 Fundal Height: 37 cm Movement: Present  Presentation: Vertex  General:  Alert, oriented and cooperative. Patient is in no acute distress.  Skin: Skin is warm and dry. No rash noted.   Cardiovascular: Normal heart rate noted  Respiratory: Normal respiratory effort, no problems with respiration noted  Abdomen: Soft, gravid, appropriate for gestational age.  Pain/Pressure: Present     Pelvic: Cervical exam deferred        Extremities: Normal range of motion.  Edema: Trace  Mental Status: Normal mood and affect. Normal behavior. Normal judgment and thought content.   Assessment and Plan:  Pregnancy: G1P0 at [redacted]w[redacted]d 1. Supervision of normal first pregnancy, antepartum FHT and FH normal. Would like induction around due date.  Term labor symptoms and general obstetric precautions including but not limited to vaginal bleeding, contractions, leaking of fluid and fetal movement were reviewed in detail with the patient. Please refer to After Visit Summary for other counseling recommendations.   No follow-ups on file.  Future Appointments  Date Time Provider  [place]  01/08/2019  9:15 AM Truett Mainland, DO [facility] None  01/15/2019  1:45 PM Truett Mainland, DO [facility] None  02/26/2019  2:30 PM Truett Mainland, DO [facility] None    Truett Mainland, DO

## 2019-01-06 ENCOUNTER — Encounter (HOSPITAL_COMMUNITY): Payer: Self-pay | Admitting: *Deleted

## 2019-01-06 ENCOUNTER — Telehealth (HOSPITAL_COMMUNITY): Payer: Self-pay | Admitting: *Deleted

## 2019-01-06 NOTE — Telephone Encounter (Signed)
Preadmission screen  

## 2019-01-08 ENCOUNTER — Other Ambulatory Visit: Payer: Self-pay

## 2019-01-08 ENCOUNTER — Ambulatory Visit (INDEPENDENT_AMBULATORY_CARE_PROVIDER_SITE_OTHER): Payer: Medicaid Other | Admitting: Family Medicine

## 2019-01-08 VITALS — BP 123/76 | HR 100 | Wt 190.0 lb

## 2019-01-08 DIAGNOSIS — Z3A38 38 weeks gestation of pregnancy: Secondary | ICD-10-CM

## 2019-01-08 DIAGNOSIS — Z3400 Encounter for supervision of normal first pregnancy, unspecified trimester: Secondary | ICD-10-CM

## 2019-01-08 DIAGNOSIS — Z3403 Encounter for supervision of normal first pregnancy, third trimester: Secondary | ICD-10-CM

## 2019-01-08 NOTE — Progress Notes (Signed)
   PRENATAL VISIT NOTE  Subjective:  Kelsey Koch is a 23 y.o. G1P0 at [redacted]w[redacted]d being seen today for ongoing prenatal care.  She is currently monitored for the following issues for this low-risk pregnancy and has Supervision of normal first pregnancy, antepartum; Vaginal itching; and Bipolar disorder (manic depression) ([place]) on their problem list.  Patient reports no complaints.  Contractions: Irregular. Vag. Bleeding: None.  Movement: Present. Denies leaking of fluid.   The following portions of the patient's history were reviewed and updated as appropriate: allergies, current medications, past family history, past medical history, past social history, past surgical history and problem list.   Objective:   Vitals:   01/08/19 0919  BP: 123/76  Pulse: 100  Weight: 190 lb (86.2 kg)    Fetal Status: Fetal Heart Rate (bpm): 133   Movement: Present  Presentation: Vertex  General:  Alert, oriented and cooperative. Patient is in no acute distress.  Skin: Skin is warm and dry. No rash noted.   Cardiovascular: Normal heart rate noted  Respiratory: Normal respiratory effort, no problems with respiration noted  Abdomen: Soft, gravid, appropriate for gestational age.  Pain/Pressure: Present     Pelvic: Cervical exam deferred Dilation: Fingertip Effacement (%): 50 Station: -3  Extremities: Normal range of motion.  Edema: None  Mental Status: Normal mood and affect. Normal behavior. Normal judgment and thought content.   Assessment and Plan:  Pregnancy: G1P0 at [redacted]w[redacted]d  1. Supervision of normal first pregnancy, antepartum FHT and FH normal. Elective induction at 40 weeks desired. Foley balloon in office next week.  Term labor symptoms and general obstetric precautions including but not limited to vaginal bleeding, contractions, leaking of fluid and fetal movement were reviewed in detail with the patient. Please refer to After Visit Summary for other counseling recommendations.   No follow-ups  on file.  Future Appointments  Date Time Provider [place]  01/14/2019  8:40 AM [facility] 1 [facility] None  01/15/2019  1:45 PM Truett Mainland, DO [facility] None  01/16/2019  6:30 AM [facility] [place] [facility] None  02/26/2019  2:30 PM Truett Mainland, DO [facility] None    Truett Mainland, DO

## 2019-01-09 ENCOUNTER — Other Ambulatory Visit: Payer: Self-pay | Admitting: Advanced Practice Midwife

## 2019-01-14 ENCOUNTER — Other Ambulatory Visit (HOSPITAL_COMMUNITY)
Admission: RE | Admit: 2019-01-14 | Discharge: 2019-01-14 | Disposition: A | Discharge status: Home / Self Care | Payer: Medicaid Other | Source: Ambulatory Visit | Attending: Obstetrics & Gynecology | Admitting: Obstetrics & Gynecology

## 2019-01-14 ENCOUNTER — Other Ambulatory Visit: Payer: Self-pay

## 2019-01-14 DIAGNOSIS — Z20828 Contact with and (suspected) exposure to other viral communicable diseases: Secondary | ICD-10-CM | POA: Insufficient documentation

## 2019-01-14 LAB — SARS CORONAVIRUS 2 BY RT PCR (HOSPITAL ORDER, PERFORMED IN ~~LOC~~ HOSPITAL LAB): SARS Coronavirus 2: NEGATIVE

## 2019-01-15 ENCOUNTER — Other Ambulatory Visit: Payer: Self-pay

## 2019-01-15 ENCOUNTER — Encounter (HOSPITAL_COMMUNITY): Payer: Self-pay

## 2019-01-15 ENCOUNTER — Inpatient Hospital Stay (EMERGENCY_DEPARTMENT_HOSPITAL)
Admission: AD | Admit: 2019-01-15 | Discharge: 2019-01-15 | Disposition: A | Discharge status: Home / Self Care | Payer: Medicaid Other | Source: Home / Self Care | Attending: Obstetrics & Gynecology | Admitting: Obstetrics & Gynecology

## 2019-01-15 ENCOUNTER — Ambulatory Visit (INDEPENDENT_AMBULATORY_CARE_PROVIDER_SITE_OTHER): Payer: Medicaid Other | Admitting: Family Medicine

## 2019-01-15 VITALS — BP 121/80 | HR 92 | Wt 189.1 lb

## 2019-01-15 DIAGNOSIS — Z3689 Encounter for other specified antenatal screening: Secondary | ICD-10-CM | POA: Diagnosis not present

## 2019-01-15 DIAGNOSIS — O42913 Preterm premature rupture of membranes, unspecified as to length of time between rupture and onset of labor, third trimester: Secondary | ICD-10-CM | POA: Diagnosis not present

## 2019-01-15 DIAGNOSIS — Z3403 Encounter for supervision of normal first pregnancy, third trimester: Secondary | ICD-10-CM | POA: Diagnosis not present

## 2019-01-15 DIAGNOSIS — Z3A39 39 weeks gestation of pregnancy: Secondary | ICD-10-CM | POA: Insufficient documentation

## 2019-01-15 DIAGNOSIS — O471 False labor at or after 37 completed weeks of gestation: Secondary | ICD-10-CM | POA: Insufficient documentation

## 2019-01-15 DIAGNOSIS — Z3400 Encounter for supervision of normal first pregnancy, unspecified trimester: Secondary | ICD-10-CM

## 2019-01-15 LAB — POCT FERN TEST: POCT Fern Test: NEGATIVE

## 2019-01-15 NOTE — MAU Provider Note (Addendum)
S: Ms. Kelsey Koch is a 23 y.o. G1P0 at [redacted]w[redacted]d  who presents to MAU today for labor evaluation.   She had a FB placed this afternoon; she reports contractions every 3 minutes. She also wants to make sure her water is not broken as she felt some clear/mucous discharge this afternoon.   Cervical exam by RN:  Dilation: 2.5 Exam by:: K. Mitchael Luckey, CNM  Fetal Monitoring: Baseline: 130 Variability: mod Accelerations: present Decelerations: absent Contractions: q2-3 min  MDM Discussed patient with RN. NST reviewed.  -Patient fern in negative -No evidence of rupture on pad, patient ambulated in room for an hour and had no LOF -FB in place; cervix at 2.5cm, still in early labor A: SIUP at [redacted]w[redacted]d  Early labor P: Discharge home Patient to follow-up with IOL scheduled for 10-2; advised patient that her LOS will be shorter and she will be more comfortable at home laboring; return to MAU when FB comes out or at her IOL time.   [place], [place] 01/15/2019 10:13 PM

## 2019-01-15 NOTE — MAU Note (Signed)
'  foley bulb' inserted at 1345. Ctx's started around 3. Unaware of bleeding or leaking. Bulb has not come out.

## 2019-01-15 NOTE — Progress Notes (Signed)
   PRENATAL VISIT NOTE  Subjective:  Kelsey Koch is a 23 y.o. G1P0 at [redacted]w[redacted]d being seen today for ongoing prenatal care.  She is currently monitored for the following issues for this low-risk pregnancy and has Supervision of normal first pregnancy, antepartum; Vaginal itching; and Bipolar disorder (manic depression) ([place]) on their problem list.  Patient reports no complaints.  Contractions: Irregular. Vag. Bleeding: None.  Movement: Present. Denies leaking of fluid.   The following portions of the patient's history were reviewed and updated as appropriate: allergies, current medications, past family history, past medical history, past social history, past surgical history and problem list.   Objective:   Vitals:   01/15/19 1349  BP: 121/80  Pulse: 92  Weight: 189 lb 1.3 oz (85.8 kg)    Fetal Status: Fetal Heart Rate (bpm): NST   Movement: Present  Presentation: Vertex  General:  Alert, oriented and cooperative. Patient is in no acute distress.  Skin: Skin is warm and dry. No rash noted.   Cardiovascular: Normal heart rate noted  Respiratory: Normal respiratory effort, no problems with respiration noted  Abdomen: Soft, gravid, appropriate for gestational age.  Pain/Pressure: Present     Pelvic: Cervical exam performed Dilation: 1 Effacement (%): 80 Station: -2  Extremities: Normal range of motion.  Edema: None  Mental Status: Normal mood and affect. Normal behavior. Normal judgment and thought content.   Assessment and Plan:  Pregnancy: G1P0 at [redacted]w[redacted]d  1. Supervision of normal first pregnancy, antepartum Foley balloon placed. Patient scheduled for induction tomorrow. NST post foley balloon placement reactive  Term labor symptoms and general obstetric precautions including but not limited to vaginal bleeding, contractions, leaking of fluid and fetal movement were reviewed in detail with the patient. Please refer to After Visit Summary for other counseling recommendations.   No  follow-ups on file.  Future Appointments  Date Time Provider [place]  01/16/2019  6:30 AM [facility] [place] [facility] None  02/26/2019  2:30 PM Truett Mainland, DO [facility] None    Truett Mainland, DO

## 2019-01-16 ENCOUNTER — Inpatient Hospital Stay (HOSPITAL_COMMUNITY): Payer: Medicaid Other | Admitting: Anesthesiology

## 2019-01-16 ENCOUNTER — Encounter (HOSPITAL_COMMUNITY): Payer: Self-pay

## 2019-01-16 ENCOUNTER — Inpatient Hospital Stay (HOSPITAL_COMMUNITY): Admission: AD | Admit: 2019-01-16 | Payer: Medicaid Other | Source: Home / Self Care | Admitting: Family Medicine

## 2019-01-16 ENCOUNTER — Inpatient Hospital Stay (HOSPITAL_COMMUNITY)
Admission: AD | Admit: 2019-01-16 | Discharge: 2019-01-19 | DRG: 805 | Disposition: A | Discharge status: Home / Self Care | Payer: Medicaid Other | Attending: Obstetrics and Gynecology | Admitting: Obstetrics and Gynecology

## 2019-01-16 ENCOUNTER — Inpatient Hospital Stay (HOSPITAL_COMMUNITY): Payer: Medicaid Other

## 2019-01-16 DIAGNOSIS — Z87891 Personal history of nicotine dependence: Secondary | ICD-10-CM

## 2019-01-16 DIAGNOSIS — O9089 Other complications of the puerperium, not elsewhere classified: Secondary | ICD-10-CM | POA: Diagnosis not present

## 2019-01-16 DIAGNOSIS — N898 Other specified noninflammatory disorders of vagina: Secondary | ICD-10-CM

## 2019-01-16 DIAGNOSIS — Z3689 Encounter for other specified antenatal screening: Secondary | ICD-10-CM | POA: Diagnosis not present

## 2019-01-16 DIAGNOSIS — O9902 Anemia complicating childbirth: Secondary | ICD-10-CM | POA: Diagnosis present

## 2019-01-16 DIAGNOSIS — Z3A40 40 weeks gestation of pregnancy: Secondary | ICD-10-CM

## 2019-01-16 DIAGNOSIS — O411230 Chorioamnionitis, third trimester, not applicable or unspecified: Secondary | ICD-10-CM | POA: Diagnosis present

## 2019-01-16 DIAGNOSIS — O480 Post-term pregnancy: Secondary | ICD-10-CM | POA: Diagnosis present

## 2019-01-16 DIAGNOSIS — R0789 Other chest pain: Secondary | ICD-10-CM | POA: Diagnosis not present

## 2019-01-16 DIAGNOSIS — Z3400 Encounter for supervision of normal first pregnancy, unspecified trimester: Secondary | ICD-10-CM

## 2019-01-16 DIAGNOSIS — D649 Anemia, unspecified: Secondary | ICD-10-CM | POA: Diagnosis present

## 2019-01-16 DIAGNOSIS — Z3490 Encounter for supervision of normal pregnancy, unspecified, unspecified trimester: Secondary | ICD-10-CM | POA: Diagnosis present

## 2019-01-16 LAB — CBC
HCT: 32.8 % — ABNORMAL LOW (ref 36.0–46.0)
Hemoglobin: 10.2 g/dL — ABNORMAL LOW (ref 12.0–15.0)
MCH: 28.3 pg (ref 26.0–34.0)
MCHC: 31.1 g/dL (ref 30.0–36.0)
MCV: 90.9 fL (ref 80.0–100.0)
Platelets: 240 10*3/uL (ref 150–400)
RBC: 3.61 MIL/uL — ABNORMAL LOW (ref 3.87–5.11)
RDW: 14.6 % (ref 11.5–15.5)
WBC: 10.9 10*3/uL — ABNORMAL HIGH (ref 4.0–10.5)
nRBC: 0.4 % — ABNORMAL HIGH (ref 0.0–0.2)

## 2019-01-16 LAB — ABO/RH: ABO/RH(D): A POS

## 2019-01-16 LAB — RPR: RPR Ser Ql: NONREACTIVE

## 2019-01-16 MED ORDER — EPHEDRINE 5 MG/ML INJ: 10.0000 mg | INTRAVENOUS | Status: DC | PRN

## 2019-01-16 MED ORDER — FENTANYL-BUPIVACAINE-NACL 0.5-0.125-0.9 MG/250ML-% EP SOLN
12.0000 mL/h | EPIDURAL | Status: DC | PRN
  Filled 2019-01-16 (×2): qty 250

## 2019-01-16 MED ORDER — OXYCODONE-ACETAMINOPHEN 5-325 MG PO TABS: 1.0000 | ORAL_TABLET | ORAL | Status: DC | PRN

## 2019-01-16 MED ORDER — LACTATED RINGERS IV SOLN: 500.0000 mL | INTRAVENOUS | Status: DC | PRN

## 2019-01-16 MED ORDER — OXYTOCIN 40 UNITS IN NORMAL SALINE INFUSION - SIMPLE MED
2.5000 [IU]/h | INTRAVENOUS | Status: DC
  Administered 2019-01-17: 2.5 [IU]/h via INTRAVENOUS
  Filled 2019-01-16: qty 1000

## 2019-01-16 MED ORDER — GENTAMICIN SULFATE 40 MG/ML IJ SOLN
5.0000 mg/kg | INTRAVENOUS | Status: DC
  Administered 2019-01-16: 340 mg via INTRAVENOUS
  Filled 2019-01-16: qty 8.5

## 2019-01-16 MED ORDER — LACTATED RINGERS IV SOLN
INTRAVENOUS | Status: DC
  Administered 2019-01-16 – 2019-01-17 (×4): via INTRAVENOUS

## 2019-01-16 MED ORDER — SOD CITRATE-CITRIC ACID 500-334 MG/5ML PO SOLN: 30.0000 mL | ORAL | Status: DC | PRN

## 2019-01-16 MED ORDER — OXYTOCIN BOLUS FROM INFUSION
500.0000 mL | Freq: Once | INTRAVENOUS | Status: AC
  Administered 2019-01-17: 500 mL via INTRAVENOUS

## 2019-01-16 MED ORDER — OXYCODONE-ACETAMINOPHEN 5-325 MG PO TABS: 2.0000 | ORAL_TABLET | ORAL | Status: DC | PRN

## 2019-01-16 MED ORDER — ACETAMINOPHEN 500 MG PO TABS
1000.0000 mg | ORAL_TABLET | Freq: Four times a day (QID) | ORAL | Status: DC | PRN
  Administered 2019-01-16: 1000 mg via ORAL
  Filled 2019-01-16: qty 2

## 2019-01-16 MED ORDER — LIDOCAINE HCL (PF) 1 % IJ SOLN: 30.0000 mL | INTRAMUSCULAR | Status: DC | PRN

## 2019-01-16 MED ORDER — TERBUTALINE SULFATE 1 MG/ML IJ SOLN: 0.2500 mg | Freq: Once | INTRAMUSCULAR | Status: DC | PRN

## 2019-01-16 MED ORDER — ONDANSETRON HCL 4 MG/2ML IJ SOLN
4.0000 mg | Freq: Four times a day (QID) | INTRAMUSCULAR | Status: DC | PRN
  Administered 2019-01-16: 4 mg via INTRAVENOUS
  Filled 2019-01-16: qty 2

## 2019-01-16 MED ORDER — ACETAMINOPHEN 325 MG PO TABS: 650.0000 mg | ORAL_TABLET | ORAL | Status: DC | PRN

## 2019-01-16 MED ORDER — SODIUM CHLORIDE 0.9 % IV SOLN
2.0000 g | Freq: Four times a day (QID) | INTRAVENOUS | Status: DC
  Administered 2019-01-16 – 2019-01-18 (×6): 2 g via INTRAVENOUS
  Filled 2019-01-16: qty 2000
  Filled 2019-01-16 (×2): qty 2
  Filled 2019-01-16 (×3): qty 2000
  Filled 2019-01-16: qty 2
  Filled 2019-01-16: qty 2000

## 2019-01-16 MED ORDER — LIDOCAINE HCL (PF) 1 % IJ SOLN
INTRAMUSCULAR | Status: DC | PRN
  Administered 2019-01-16: 2 mL via EPIDURAL
  Administered 2019-01-16: 10 mL via EPIDURAL

## 2019-01-16 MED ORDER — OXYTOCIN 40 UNITS IN NORMAL SALINE INFUSION - SIMPLE MED
1.0000 m[IU]/min | INTRAVENOUS | Status: DC
  Administered 2019-01-16: 2 m[IU]/min via INTRAVENOUS
  Filled 2019-01-16: qty 1000

## 2019-01-16 MED ORDER — DIPHENHYDRAMINE HCL 50 MG/ML IJ SOLN: 12.5000 mg | INTRAMUSCULAR | Status: DC | PRN

## 2019-01-16 MED ORDER — FENTANYL CITRATE (PF) 100 MCG/2ML IJ SOLN
100.0000 ug | INTRAMUSCULAR | Status: DC | PRN
  Administered 2019-01-16: 100 ug via INTRAVENOUS
  Filled 2019-01-16: qty 2

## 2019-01-16 MED ORDER — SODIUM CHLORIDE (PF) 0.9 % IJ SOLN
INTRAMUSCULAR | Status: DC | PRN
  Administered 2019-01-16: 12 mL/h via EPIDURAL

## 2019-01-16 MED ORDER — PHENYLEPHRINE 40 MCG/ML (10ML) SYRINGE FOR IV PUSH (FOR BLOOD PRESSURE SUPPORT): 80.0000 ug | PREFILLED_SYRINGE | INTRAVENOUS | Status: DC | PRN

## 2019-01-16 MED ORDER — LACTATED RINGERS IV SOLN
500.0000 mL | Freq: Once | INTRAVENOUS | Status: AC
  Administered 2019-01-16: 500 mL via INTRAVENOUS

## 2019-01-16 NOTE — Anesthesia Procedure Notes (Signed)
Epidural Patient location during procedure: OB Start time: 01/16/2019 2:26 PM End time: 01/16/2019 2:37 PM  Staffing Anesthesiologist: Pervis Hocking, DO Performed: anesthesiologist   Preanesthetic Checklist Completed: patient identified, pre-op evaluation, timeout performed, IV checked, risks and benefits discussed and monitors and equipment checked  Epidural Patient position: sitting Prep: site prepped and draped and DuraPrep Patient monitoring: continuous pulse ox, blood pressure, heart rate and cardiac monitor Approach: midline Location: L3-L4 Injection technique: LOR air  Needle:  Needle type: Tuohy  Needle gauge: 17 G Needle length: 9 cm Needle insertion depth: 5 cm Catheter type: closed end flexible Catheter size: 19 Gauge Catheter at skin depth: 10 cm Test dose: negative  Assessment Sensory level: T8 Events: blood not aspirated, injection not painful, no injection resistance, negative IV test and no paresthesia  Additional Notes Patient identified. Risks/Benefits/Options discussed with patient including but not limited to bleeding, infection, nerve damage, paralysis, failed block, incomplete pain control, headache, blood pressure changes, nausea, vomiting, reactions to medication both or allergic, itching and postpartum back pain. Confirmed with bedside nurse the patient's most recent platelet count. Confirmed with patient that they are not currently taking any anticoagulation, have any bleeding history or any family history of bleeding disorders. Patient expressed understanding and wished to proceed. All questions were answered. Sterile technique was used throughout the entire procedure. Please see nursing notes for vital signs. Test dose was given through epidural catheter and negative prior to continuing to dose epidural or start infusion. Warning signs of high block given to the patient including shortness of breath, tingling/numbness in hands, complete motor  block, or any concerning symptoms with instructions to call for help. Patient was given instructions on fall risk and not to get out of bed. All questions and concerns addressed with instructions to call with any issues or inadequate analgesia.  Reason for block:procedure for pain

## 2019-01-16 NOTE — H&P (Signed)
Kelsey Koch is a 23 y.o. female G1P0 with IUP at [redacted]w[redacted]d presenting for IOL for postdates. Pt states she has been having contractions that started at 3 pm after having FB placed in the office by Dr. Nehemiah Settle. It was  associated with none vaginal bleeding for hours, other than occasional spotting throughout the night.  Membranes are intact, with active fetal movement.   [facility] at [facility] since 7  wks and 4 days.   Prenatal History/Complications:  Past Medical History: Past Medical History:  Diagnosis Date  . Medical history non-contributory     Past Surgical History: Past Surgical History:  Procedure Laterality Date  . NO PAST SURGERIES      Obstetrical History: OB History    Gravida  1   Para      Term      Preterm      AB      Living        SAB      TAB      Ectopic      Multiple      Live Births               Social History: Social History   Socioeconomic History  . Marital status: Single    Spouse name: Not on file  . Number of children: Not on file  . Years of education: Not on file  . Highest education level: Not on file  Occupational History  . Not on file  Social Needs  . Financial resource strain: Not hard at all  . Food insecurity    Worry: Never true    Inability: Never true  . Transportation needs    Medical: No    Non-medical: No  Tobacco Use  . Smoking status: Former Smoker    Packs/day: 0.25    Years: 3.00    Pack years: 0.75    Types: Cigarettes    Quit date: 02/2018    Years since quitting: 0.9  . Smokeless tobacco: Never Used  Substance and Sexual Activity  . Alcohol use: Not Currently    Alcohol/week: 2.0 standard drinks    Types: 2 Glasses of wine per week    Frequency: Never  . Drug use: Never  . Sexual activity: Yes    Birth control/protection: None  Lifestyle  . Physical activity    Days per week: Not on file    Minutes per session: Not on file  . Stress: Not on file  Relationships  . Social Clinical research associate on phone: Not on file    Gets together: Not on file    Attends religious service: Not on file    Active member of club or organization: Not on file    Attends meetings of clubs or organizations: Not on file    Relationship status: Not on file  Other Topics Concern  . Not on file  Social History Narrative  . Not on file    Family History: Family History  Problem Relation Age of Onset  . Ovarian cysts Mother   . Arthritis Mother     Allergies: Allergies  Allergen Reactions  . Ethinyl Estradiol   . Kiwi Extract   . Levonorgestrel   . Onion     Medications Prior to Admission  Medication Sig Dispense Refill Last Dose  . lamoTRIgine (LAMICTAL) 25 MG tablet Take 25 mg by mouth daily.     . Prenatal Vit-Fe Fumarate-FA (PRENATAL VITAMINS PO) Take by mouth.     Marland Kitchen  promethazine (PHENERGAN) 25 MG tablet Take 25 mg by mouth every 6 (six) hours as needed for nausea or vomiting.           Review of Systems   Constitutional: Negative for fever and chills Eyes: Negative for visual disturbances Respiratory: Negative for shortness of breath, dyspnea Cardiovascular: Negative for chest pain or palpitations  Gastrointestinal: Negative for vomiting, diarrhea and constipation.  POSITIVE for abdominal pain (contractions) Genitourinary: Negative for dysuria and urgency Musculoskeletal: Negative for back pain, joint pain, myalgias  Neurological: Negative for dizziness and headaches      Blood pressure 129/80, pulse 100, temperature 98.5 F (36.9 C), temperature source Oral, last menstrual period 04/11/2018. General appearance: alert and no distress Lungs: normal respiratory effort Heart: regular rate and rhythm Abdomen: soft, non-tender; bowel sounds normal Extremities: Homans sign is negative, no sign of DVT DTR's 2+ Presentation: cephalic Fetal monitoring  Baseline: 130 bpm Uterine activity  Contractions q 3  Dilation: 4.5   Prenatal labs: ABO, Rh: --/--/PENDING  (10/02 1610) Antibody: PENDING (10/02 0806) Rubella: 3.61 (02/18 1106) RPR: Non Reactive (07/15 0921)  HBsAg: Negative (02/18 1106)  HIV: Non Reactive (07/15 0921)  GBS: Negative/-- (09/09 1350)  1 hr Glucola 81-106- 95 Genetic screening  negative Anatomy US normal  Prenatal Transfer Tool  Maternal Diabetes: No Genetic Screening: Normal Maternal Ultrasounds/Referrals: Normal Fetal Ultrasounds or other Referrals:  None Maternal Substance Abuse:  No Significant Maternal Medications:  Meds include: Other: lamictal Significant Maternal Lab Results: None     Results for orders placed or performed during the hospital encounter of 01/16/19 (from the past 24 hour(s))  CBC   Collection Time: 01/16/19  8:06 AM  Result Value Ref Range   WBC 10.9 (H) 4.0 - 10.5 K/uL   RBC 3.61 (L) 3.87 - 5.11 MIL/uL   Hemoglobin 10.2 (L) 12.0 - 15.0 g/dL   HCT 96.0 (L) 45.4 - 09.8 %   MCV 90.9 80.0 - 100.0 fL   MCH 28.3 26.0 - 34.0 pg   MCHC 31.1 30.0 - 36.0 g/dL   RDW 11.9 14.7 - 82.9 %   Platelets 240 150 - 400 K/uL   nRBC 0.4 (H) 0.0 - 0.2 %  Type and screen   Collection Time: 01/16/19  8:06 AM  Result Value Ref Range   ABO/RH(D) PENDING    Antibody Screen PENDING    Sample Expiration      01/19/2019,2359 Performed at [facility], [address] [place], [place] [address]   Results for orders placed or performed during the hospital encounter of 01/15/19 (from the past 24 hour(s))  [facility] Time: 01/15/19  7:07 PM  Result Value Ref Range   POCT Fern Test Negative = intact amniotic membranes     Assessment: Kelsey Koch is a 23 y.o. G1P0 with an IUP at [redacted]w[redacted]d presenting for IOL for postdates.   Plan: #Labor: expectant management #Pain:  Per request #FWB Cat 1 #ID: GBS: negative #MOF:  breast #MOC: Depo (but not in hospital) #Circ: NA (female)   Charlesetta Garibaldi Ebon Ketchum 01/16/2019, 9:03 AM

## 2019-01-16 NOTE — Anesthesia Preprocedure Evaluation (Signed)
Anesthesia Evaluation  Patient identified by MRN, date of birth, ID band Patient awake    Reviewed: Allergy & Precautions, NPO status , Patient's Chart, lab work & pertinent test results  Airway Mallampati: II  TM Distance: >3 FB Neck ROM: Full    Dental no notable dental hx.    Pulmonary neg pulmonary ROS, former smoker,    Pulmonary exam normal breath sounds clear to auscultation       Cardiovascular negative cardio ROS Normal cardiovascular exam Rhythm:Regular Rate:Normal     Neuro/Psych PSYCHIATRIC DISORDERS Bipolar Disorder negative neurological ROS     GI/Hepatic negative GI ROS, Neg liver ROS,   Endo/Other  negative endocrine ROS  Renal/GU negative Renal ROS  negative genitourinary   Musculoskeletal negative musculoskeletal ROS (+)   Abdominal   Peds negative pediatric ROS (+)  Hematology negative hematology ROS (+)   Anesthesia Other Findings   Reproductive/Obstetrics (+) Pregnancy                            Anesthesia Physical Anesthesia Plan  ASA: II  Anesthesia Plan: Epidural   Post-op Pain Management:    Induction:   PONV Risk Score and Plan: 2  Airway Management Planned: Natural Airway  Additional Equipment: None  Intra-op Plan:   Post-operative Plan:   Informed Consent: I have reviewed the patients History and Physical, chart, labs and discussed the procedure including the risks, benefits and alternatives for the proposed anesthesia with the patient or authorized representative who has indicated his/her understanding and acceptance.       Plan Discussed with:   Anesthesia Plan Comments:         Anesthesia Quick Evaluation

## 2019-01-16 NOTE — Plan of Care (Signed)

## 2019-01-16 NOTE — Progress Notes (Signed)
   Kelsey Koch is a 23 y.o. G1P0 at [redacted]w[redacted]d  admitted for induction of labor due to Post dates. Due date 10-2.  Subjective:  Comfortable with epidural Objective: Vitals:   01/16/19 1455 01/16/19 1500 01/16/19 1530 01/16/19 1600  BP: 123/68 122/68 128/68 115/66  Pulse: 87 82 83 82  Resp:      Temp:      TempSrc:      Weight:      Height:       No intake/output data recorded.  FHT:  FHR: 130 bpm, variability: moderate,  accelerations:  Present,  decelerations:  Absent UC:   Not tracing welll; will insert IUPC SVE:   Dilation: 5 Effacement (%): 80 Station: -2 Exam by:: St[place], DO Pitocin @ 12 mu/min  Labs: Lab Results  Component Value Date   WBC 10.9 (H) 01/16/2019   HGB 10.2 (L) 01/16/2019   HCT 32.8 (L) 01/16/2019   MCV 90.9 01/16/2019   PLT 240 01/16/2019    Assessment / Plan: IOL for postdates; cervix is unchanged from 1530; inserted IUPC to continue to titrate pitocin. will reposition in bed to assist with descent.   Labor: Day 1 IOL, still 5/80/-2 Fetal Wellbeing:  Category I Pain Control:  Epidural Anticipated MOD:  NSVD  Kelsey Koch 01/16/2019, 6:17 PM

## 2019-01-16 NOTE — Progress Notes (Signed)
Patient ID: Kelsey Koch, female   DOB: 02/09/1996, 23 y.o.   MRN: [id number]  Comfortable with epidural.  BP 123/68   Pulse 87   Temp 98 F (36.7 C) (Oral)   Resp 16   Ht 5\' 4"  (1.626 m)   Wt 86.2 kg   LMP 04/11/2018 (Exact Date)   BMI 32.61 kg/m  Dilation: 5 Effacement (%): 80 Cervical Position: Middle Station: -2 Presentation: Vertex Exam by:: Marilla Boddy, DO  Cat 1 tracing AROM with clear fluid Continue to titrate pitocin.  Truett Mainland, DO

## 2019-01-16 NOTE — Progress Notes (Signed)
LABOR PROGRESS NOTE  [name] is a 23 y.o. G1P0 at [redacted]w[redacted]d  admitted for PD IOL.  Subjective: Feeling warm  Epidural working well  Objective: BP 121/78   Pulse (!) 105   Temp 100.1 F (37.8 C) (Axillary)   Resp 19   Ht 5\' 4"  (1.626 m)   Wt 86.2 kg   LMP 04/11/2018 (Exact Date)   SpO2 99%   BMI 32.61 kg/m  or  Vitals:   01/16/19 2100 01/16/19 2126 01/16/19 2129 01/16/19 2130  BP: 132/81   121/78  Pulse: 95   (!) 105  Resp: 19   19  Temp:  100.1 F (37.8 C)    TempSrc:  Axillary    SpO2:   99%   Weight:      Height:         Dilation: 5 Effacement (%): 70 Cervical Position: Middle Station: -2 Presentation: Vertex Exam by:: kooistra FHT: baseline rate 180, moderate varibility, -acel, ? Prolonged decel Toco: q3 min  Labs: Lab Results  Component Value Date   WBC 10.9 (H) 01/16/2019   HGB 10.2 (L) 01/16/2019   HCT 32.8 (L) 01/16/2019   MCV 90.9 01/16/2019   PLT 240 01/16/2019    Patient Active Problem List   Diagnosis Date Noted  . Encounter for induction of labor 01/16/2019  . Bipolar disorder (manic depression) ([place]) 11/23/2018  . Vaginal itching 09/30/2018  . Supervision of normal first pregnancy, antepartum 06/03/2018    Assessment / Plan: 23 y.o. G1P0 at [redacted]w[redacted]d here for PD IOL.  Labor: 5cm since 1515 with AROM at that time as well. On pitocin, was at 14 but paused and now resumed at 10. Patient also now developing chorioamnionitis with elevated temp and fetal tachycardia. Cont induction w pitocin, monitor closely. Chorioamnionitis: start amp/gent, tylenol 1g q6h Fetal Wellbeing:  Cat II, unclear if in setting of developing chorio vs reactive tachycardia after decel, monitor closely Pain Control:  Epidural, working well Anticipated MOD:  [place], MD/[facility] OB Fellow  01/16/2019, 10:12 PM

## 2019-01-16 NOTE — Progress Notes (Signed)
ANTIBIOTIC CONSULT NOTE - INITIAL  Pharmacy Consult for Gentamicin Indication: Chorioamnionitis  Allergies  Allergen Reactions  . Levonorgestrel   . Ethinyl Estradiol Other (See Comments)    Agitated   . Kiwi Extract Itching  . Onion Itching    Patient Measurements: Height: 5\' 4"  (162.6 cm) Weight: 190 lb (86.2 kg) IBW/kg (Calculated) : 54.7kg Adjusted Body Weight: 67.3 kg  Vital Signs: Temp: 100.1 F (37.8 C) (10/02 2126) Temp Source: Axillary (10/02 2126) BP: 121/78 (10/02 2130) Pulse Rate: 105 (10/02 2130)  Labs: Recent Labs    01/16/19 0806  WBC 10.9*  HGB 10.2*  PLT 240    Microbiology: Recent Results (from the past 720 hour(s))  Culture, beta strep (group b only)     Status: None   Collection Time: 12/24/18  1:50 PM   Specimen: Vaginal/Rectal; Genital   GENITAL  Result Value Ref Range Status   Strep Gp B Culture Negative Negative Final    Comment: Centers for Disease Control and Prevention (CDC) and American Congress of Obstetricians and Gynecologists (ACOG) guidelines for prevention of perinatal group B streptococcal (GBS) disease specify co-collection of a vaginal and rectal swab specimen to maximize sensitivity of GBS detection. Per the CDC and ACOG, swabbing both the lower vagina and rectum substantially increases the yield of detection compared with sampling the vagina alone. Penicillin G, ampicillin, or cefazolin are indicated for intrapartum prophylaxis of perinatal GBS colonization. Reflex susceptibility testing should be performed prior to use of clindamycin only on GBS isolates from penicillin-allergic women who are considered a high risk for anaphylaxis. Treatment with vancomycin without additional testing is warranted if resistance to clindamycin is noted.   SARS Coronavirus 2 [facility] order, Performed in [facility] hospital lab) Nasopharyngeal Nasopharyngeal Swab     Status: None   Collection Time: 01/14/19  8:56 AM   Specimen:  Nasopharyngeal Swab  Result Value Ref Range Status   SARS Coronavirus 2 NEGATIVE NEGATIVE Final    Comment: (NOTE) If result is NEGATIVE SARS-CoV-2 target nucleic acids are NOT DETECTED. The SARS-CoV-2 RNA is generally detectable in upper and lower  respiratory specimens during the acute phase of infection. The lowest  concentration of SARS-CoV-2 viral copies this assay can detect is 250  copies / mL. A negative result does not preclude SARS-CoV-2 infection  and should not be used as the sole basis for treatment or other  patient management decisions.  A negative result may occur with  improper specimen collection / handling, submission of specimen other  than nasopharyngeal swab, presence of viral mutation(s) within the  areas targeted by this assay, and inadequate number of viral copies  (<250 copies / mL). A negative result must be combined with clinical  observations, patient history, and epidemiological information. If result is POSITIVE SARS-CoV-2 target nucleic acids are DETECTED. The SARS-CoV-2 RNA is generally detectable in upper and lower  respiratory specimens dur ing the acute phase of infection.  Positive  results are indicative of active infection with SARS-CoV-2.  Clinical  correlation with patient history and other diagnostic information is  necessary to determine patient infection status.  Positive results do  not rule out bacterial infection or co-infection with other viruses. If result is PRESUMPTIVE POSTIVE SARS-CoV-2 nucleic acids MAY BE PRESENT.   A presumptive positive result was obtained on the submitted specimen  and confirmed on repeat testing.  While 2019 novel coronavirus  (SARS-CoV-2) nucleic acids may be present in the submitted sample  additional confirmatory testing may be necessary  for epidemiological  and / or clinical management purposes  to differentiate between  SARS-CoV-2 and other Sarbecovirus currently known to infect humans.  If clinically  indicated additional testing with an alternate test  methodology [phone]) is advised. The SARS-CoV-2 RNA is generally  detectable in upper and lower respiratory sp ecimens during the acute  phase of infection. The expected result is Negative. Fact Sheet for Patients:  StrictlyIdeas.no Fact Sheet for Healthcare Providers: BankingDealers.co.za This test is not yet approved or cleared by the [place] FDA and has been authorized for detection and/or diagnosis of SARS-CoV-2 by FDA under an Emergency Use Authorization (EUA).  This EUA will remain in effect (meaning this test can be used) for the duration of the COVID-19 declaration under Section 564(b)(1) of the Act, 21 U.S.C. section 360bbb-3(b)(1), unless the authorization is terminated or revoked sooner. Performed at [facility], [place] [address] [place], [place] [address]     Medications:  Ampicillin 2 grams IV Q6 hr  Assessment: 23 y.o. female G1P0 at [redacted]w[redacted]d admitted for IOL for postdates.  Now with fever - suspected chorioamnionitis  Plan:  Gentamicin 5 mg/kg IV every 24 hrs  Check Scr with next labs if gentamicin continued. Will check gentamicin levels if continued > 72hr or clinically indicated.  Beryle Lathe 01/16/2019,10:15 PM

## 2019-01-17 ENCOUNTER — Encounter (HOSPITAL_COMMUNITY): Payer: Self-pay | Admitting: *Deleted

## 2019-01-17 DIAGNOSIS — Z3A40 40 weeks gestation of pregnancy: Secondary | ICD-10-CM

## 2019-01-17 MED ORDER — BENZOCAINE-MENTHOL 20-0.5 % EX AERO
1.0000 "application " | INHALATION_SPRAY | CUTANEOUS | Status: DC | PRN
  Administered 2019-01-17: 1 via TOPICAL
  Filled 2019-01-17: qty 56

## 2019-01-17 MED ORDER — DIBUCAINE (PERIANAL) 1 % EX OINT: 1.0000 "application " | TOPICAL_OINTMENT | CUTANEOUS | Status: DC | PRN

## 2019-01-17 MED ORDER — ZOLPIDEM TARTRATE 5 MG PO TABS: 5.0000 mg | ORAL_TABLET | Freq: Every evening | ORAL | Status: DC | PRN

## 2019-01-17 MED ORDER — ONDANSETRON HCL 4 MG PO TABS: 4.0000 mg | ORAL_TABLET | ORAL | Status: DC | PRN

## 2019-01-17 MED ORDER — SIMETHICONE 80 MG PO CHEW
80.0000 mg | CHEWABLE_TABLET | ORAL | Status: DC | PRN
  Administered 2019-01-19: 80 mg via ORAL
  Filled 2019-01-17: qty 1

## 2019-01-17 MED ORDER — DIPHENHYDRAMINE HCL 25 MG PO CAPS: 25.0000 mg | ORAL_CAPSULE | Freq: Four times a day (QID) | ORAL | Status: DC | PRN

## 2019-01-17 MED ORDER — COCONUT OIL OIL
1.0000 "application " | TOPICAL_OIL | Status: DC | PRN
  Administered 2019-01-17: 1 via TOPICAL

## 2019-01-17 MED ORDER — IBUPROFEN 600 MG PO TABS
600.0000 mg | ORAL_TABLET | Freq: Four times a day (QID) | ORAL | Status: DC
  Administered 2019-01-17 – 2019-01-19 (×8): 600 mg via ORAL
  Filled 2019-01-17 (×8): qty 1

## 2019-01-17 MED ORDER — ONDANSETRON HCL 4 MG/2ML IJ SOLN: 4.0000 mg | INTRAMUSCULAR | Status: DC | PRN

## 2019-01-17 MED ORDER — PRENATAL MULTIVITAMIN CH
1.0000 | ORAL_TABLET | Freq: Every day | ORAL | Status: DC
  Administered 2019-01-17 – 2019-01-18 (×2): 1 via ORAL
  Filled 2019-01-17 (×2): qty 1

## 2019-01-17 MED ORDER — SENNOSIDES-DOCUSATE SODIUM 8.6-50 MG PO TABS
2.0000 | ORAL_TABLET | ORAL | Status: DC
  Administered 2019-01-17 – 2019-01-19 (×2): 2 via ORAL
  Filled 2019-01-17 (×2): qty 2

## 2019-01-17 MED ORDER — TETANUS-DIPHTH-ACELL PERTUSSIS 5-2.5-18.5 LF-MCG/0.5 IM SUSP: 0.5000 mL | Freq: Once | INTRAMUSCULAR | Status: DC

## 2019-01-17 MED ORDER — ACETAMINOPHEN 325 MG PO TABS
650.0000 mg | ORAL_TABLET | ORAL | Status: DC | PRN
  Administered 2019-01-17 – 2019-01-19 (×2): 650 mg via ORAL
  Filled 2019-01-17 (×2): qty 2

## 2019-01-17 MED ORDER — WITCH HAZEL-GLYCERIN EX PADS: 1.0000 "application " | MEDICATED_PAD | CUTANEOUS | Status: DC | PRN

## 2019-01-17 NOTE — Progress Notes (Signed)
LABOR PROGRESS NOTE  Kelsey Koch is a 23 y.o. G1P0 at [redacted]w[redacted]d  admitted for PD IOL.  Subjective: Comfortable, not feeling pressures  Objective: BP 106/66   Pulse 86   Temp 98.4 F (36.9 C) (Oral)   Resp 18   Ht 5\' 4"  (1.626 m)   Wt 86.2 kg   LMP 04/11/2018 (Exact Date)   SpO2 97%   BMI 32.61 kg/m  or  Vitals:   01/17/19 0324 01/17/19 0329 01/17/19 0330 01/17/19 0359  BP:   106/66   Pulse:   86   Resp:   18   Temp:    98.4 F (36.9 C)  TempSrc:    Oral  SpO2: 97% 97%    Weight:      Height:         Dilation: 5.5 Effacement (%): 90 Cervical Position: Middle Station: -1, 0 Presentation: Vertex Exam by:: Dr. Dione Plover FHT: baseline rate 125, moderate varibility, +acel, early decel Toco: q2-4 min, not adequate  Labs: Lab Results  Component Value Date   WBC 10.9 (H) 01/16/2019   HGB 10.2 (L) 01/16/2019   HCT 32.8 (L) 01/16/2019   MCV 90.9 01/16/2019   PLT 240 01/16/2019    Patient Active Problem List   Diagnosis Date Noted  . Encounter for induction of labor 01/16/2019  . Bipolar disorder (manic depression) ([place]) 11/23/2018  . Vaginal itching 09/30/2018  . Supervision of normal first pregnancy, antepartum 06/03/2018    Assessment / Plan: 23 y.o. G1P0 at [redacted]w[redacted]d here for PD IOL.  Labor: 5-6 cm since 1515 with AROM at that time as well. Some change between 9 PM and 1AM, however no change on this most recent exam. MVU's still not adequate at this time, discussed slow progress and possible need for CS however at this time safe to continue as strip is reassuring and we are not yet at adequate contractions.   Chorioamnionitis: on amp/gent, tylenol 1g q6h. Fetal Wellbeing:  Cat I Pain Control:  Epidural, working well Anticipated MOD:  Guarded for SVD   Augustin Coupe, MD/MP[facility] OB Fellow  01/17/2019, 4:36 AM

## 2019-01-17 NOTE — Lactation Note (Signed)
This note was copied from a baby's chart. Lactation Consultation Note  Patient Name: Girl Kess Mcilwain [id number] Date: 01/17/2019 Reason for consult: Initial assessment;Primapara   P1 mom.  Taking Lamictal 25mg /day during pregnancy.  L2-Medications in Mother's Milk; Rodell Perna.    Mom has collected approx. 2 ounces of colostrum she has at home in freezer.  She requested hand pump; LC provided.  All parts put together and explained.    Mom says infant has fed well.  She is slightly tender on right nipple.  LC offered assistance with feed.  In football hold, mom leaned over to infant to latch.  LC used pillow and encouraged head support to bring infant to the breast when gape is wide.  Infant latched then fell asleep almost immediately.  Position was changed to cross cradle.  [place] worked with mom on ways to achieve a deeper latch.  Infant was too sleepy to feed.  Mom is able to hand express; LC reviewed and offered to assist in collected colostrum for later feed; mom declined.  Mom did inquire about when she may need to use samples of formula.  Education provided on stomach size, colostrum, and supplementation of EBM with spoon or other ways if infant could not latch.  Education given as mom desires to provide breastmilk to infant.   LC encouraged mom to work on bringing infant to breast with latch and good pillow support during feeds.  Mom knows to hand exp after feed and leave colostrum on nipple for comfort.    Lactation services discussed and brochure left with phone numbers.    Maternal Data Has patient been taught Hand Expression?: Yes Does the patient have breastfeeding experience prior to this delivery?: No  Feeding Feeding Type: Breast Fed  LATCH Score Latch: Too sleepy or reluctant, no latch achieved, no sucking elicited.  Audible Swallowing: None  Type of Nipple: Everted at rest and after stimulation  Comfort (Breast/Nipple): Filling, red/small blisters or bruises, mild/mod  discomfort  Hold (Positioning): Assistance needed to correctly position infant at breast and maintain latch.  LATCH Score: 4  Interventions Interventions: Breast feeding basics reviewed;Assisted with latch;Skin to skin;Hand express;Breast compression;Adjust position;Position options;Support pillows  Lactation Tools Discussed/Used     Consult Status Consult Status: Follow-up Date: 01/18/19 Follow-up type: In-patient    Ferne Coe [facility] 01/17/2019, 9:20 PM

## 2019-01-17 NOTE — Discharge Summary (Addendum)
Postpartum Discharge Summary    Patient Name: Kelsey Koch DOB: 11-20-1995 MRN: [id number]  Date of admission: 01/16/2019 Delivering Provider: Wende Mott   Date of discharge: 01/19/2019  Admitting diagnosis: Pregnancy Intrauterine pregnancy: [redacted]w[redacted]d    Secondary diagnosis:  Active Problems:   Encounter for induction of labor  Additional problems: chorioamnionitis, symptomatic anemia     Discharge diagnosis: Term Pregnancy Delivered                                                                                                Post partum procedures:n/a  Augmentation: AROM, Pitocin, Cytotec and Foley Balloon  Complications: None,   Hospital course:  Induction of Labor With Vaginal Delivery   23y.o. yo G1P0 at 434w1das admitted to the hospital 01/16/2019 for induction of labor.  Indication for induction: Postdates.  Patient had an uncomplicated labor course as follows: Membrane Rupture Time/Date: 3:23 PM ,01/16/2019   Intrapartum Procedures: Episiotomy: None [1]                                         Lacerations:  2nd degree [3];Perineal [11]  Patient had delivery of a Viable infant.  Information for the patient's newborn:  AlJamiee, Mil[place]irl LyMalachi0[[id number]]Delivery Method: Vaginal, Spontaneous(Filed from Delivery Summary)    01/17/2019  Details of delivery can be found in separate delivery note.  Patient had a routine postpartum course. She developed symptomatic anemia secondary to postpartum menorrhaea that improved. Hemoglobin dropped to 6.9. She was transfused 2U pRBC and treated with Methergine with improvement in symptoms. Patient is discharged home 01/19/19. Delivery time: 9:28 AM    Magnesium Sulfate received: No BMZ received: No Rhophylac:No MMR:No Transfusion:Yes  Physical exam  Vitals:   01/18/19 1742 01/18/19 2100 01/19/19 0452 01/19/19 0550  BP: 120/90 122/84 129/85 (!) 134/92  Pulse: 61 70 74 86  Resp: '20 18 16 18  '$ Temp: 98.8 F (37.1 C) 99.1  F (37.3 C)  98.4 F (36.9 C)  TempSrc:  Oral  Oral  SpO2:  99%    Weight:      Height:       General: alert, cooperative and no distress Lochia: appropriate Uterine Fundus: firm Incision: N/A DVT Evaluation: No evidence of DVT seen on physical exam. Labs: Lab Results  Component Value Date   WBC 14.3 (H) 01/18/2019   HGB 10.5 (L) 01/18/2019   HCT 32.2 (L) 01/18/2019   MCV 89.7 01/18/2019   PLT 183 01/18/2019   No flowsheet data found.  Discharge instruction: per After Visit Summary and "Baby and Me Booklet".  After visit meds:  Allergies as of 01/19/2019      Reactions   Levonorgestrel    Ethinyl Estradiol Other (See Comments)   Agitated    Kiwi Extract Itching   Onion Itching      Medication List    STOP taking these medications   promethazine 25 MG tablet Commonly known as: PHENERGAN     TAKE  these medications   ibuprofen 600 MG tablet Commonly known as: ADVIL Take 1 tablet (600 mg total) by mouth every 6 (six) hours.   lamoTRIgine 25 MG tablet Commonly known as: LAMICTAL Take 25 mg by mouth daily.   PRENATAL VITAMINS PO Take by mouth.   senna-docusate 8.6-50 MG tablet Commonly known as: Senokot-S Take 2 tablets by mouth daily. Start taking on: January 20, 2019   Tdap 5-2.5-18.5 LF-MCG/0.5 injection Commonly known as: BOOSTRIX Inject 0.5 mLs into the muscle once for 1 dose.       Diet: routine diet  Activity: Advance as tolerated. Pelvic rest for 6 weeks.   Outpatient follow up:4 weeks Follow up Appt: Future Appointments  Date Time Provider [place]  02/26/2019  2:30 PM Truett Mainland, DO [facility] None   Follow up Visit: [place]. Schedule an appointment as soon as possible for a visit in 4 week(s).   Specialty: Obstetrics and Gynecology Why: for postpartum follow up Contact information: [place]   [id number] [phone]          Please schedule this patient for Postpartum visit in: 4 weeks with the following provider: Any provider For C/S patients schedule nurse incision check in weeks 2 weeks: no Low risk pregnancy complicated by: n/a Delivery mode:  SVD Anticipated Birth Control:  Depo PP Procedures needed: n/a  Schedule Integrated BH visit: no   Newborn Data: Live born female  Birth Weight:   APGAR: 53, 9  Newborn Delivery   Birth date/time: 01/17/2019 09:28:00 Delivery type: Vaginal, Spontaneous      Baby Feeding: Bottle and Breast Disposition:home with mother  Mina Marble, [place], PGY2 01/19/2019  I confirm that I have verified the information documented in the resident's note and that I have also personally reperformed the history, physical exam and all medical decision making activities of this service and have verified that all service and findings are accurately documented in this student's note.   Wende Mott, [place] 01/19/2019 8:45 AM

## 2019-01-17 NOTE — Progress Notes (Signed)
LABOR PROGRESS NOTE  Kelsey Koch is a 23 y.o. G1P0 at [redacted]w[redacted]d  admitted for PD IOL.  Subjective: Comfortable  Objective: BP (!) 119/53   Pulse 96   Temp 99.5 F (37.5 C) (Oral)   Resp 18   Ht 5\' 4"  (1.626 m)   Wt 86.2 kg   LMP 04/11/2018 (Exact Date)   SpO2 97%   BMI 32.61 kg/m  or  Vitals:   01/16/19 2329 01/16/19 2330 01/16/19 2359 01/17/19 0000  BP:  (!) 120/59  (!) 119/53  Pulse:  (!) 102  96  Resp:  18  18  Temp:      TempSrc:      SpO2: 97%  97%   Weight:      Height:         Dilation: 5.5 Effacement (%): 90 Cervical Position: Middle Station: -1, 0 Presentation: Vertex Exam by:: Dr. Dione Plover FHT: baseline rate 120, moderate varibility, +acel, -decel Toco: q1-2 min, not adequate  Labs: Lab Results  Component Value Date   WBC 10.9 (H) 01/16/2019   HGB 10.2 (L) 01/16/2019   HCT 32.8 (L) 01/16/2019   MCV 90.9 01/16/2019   PLT 240 01/16/2019    Patient Active Problem List   Diagnosis Date Noted  . Encounter for induction of labor 01/16/2019  . Bipolar disorder (manic depression) ([place]) 11/23/2018  . Vaginal itching 09/30/2018  . Supervision of normal first pregnancy, antepartum 06/03/2018    Assessment / Plan: 23 y.o. G1P0 at [redacted]w[redacted]d here for PD IOL.  Labor: 5-6 cm since 1515 with AROM at that time as well. On my exam has had some effacement and descent of infant, encouraging for progress. Contractions have been inadequate since 2100 though. Will recheck in 1-2 hours, and given lack of progress over almost 6 hours did discuss with patient will need to consider CS if no change at next check. Chorioamnionitis: on amp/gent, tylenol 1g q6h. Fetal Wellbeing:  Cat I Pain Control:  Epidural, working well Anticipated MOD:  Guarded for SVD   Augustin Coupe, MD/MPH OB Fellow  01/17/2019, 1:42 AM

## 2019-01-17 NOTE — Anesthesia Postprocedure Evaluation (Signed)
Anesthesia Post Note  Patient: Kelsey Koch  Procedure(s) Performed: AN AD HOC LABOR EPIDURAL     Patient location during evaluation: Mother Baby Anesthesia Type: Epidural Level of consciousness: awake Pain management: satisfactory to patient Vital Signs Assessment: post-procedure vital signs reviewed and stable Respiratory status: spontaneous breathing Cardiovascular status: stable Anesthetic complications: no    Last Vitals:  Vitals:   01/17/19 1215 01/17/19 1330  BP: 117/76 126/80  Pulse: (!) 104 99  Resp: 20 20  Temp: 37.7 C 37.4 C  SpO2:      Last Pain:  Vitals:   01/17/19 1359  TempSrc:   PainSc: 2    Pain Goal:                   [employer]

## 2019-01-18 LAB — CBC
HCT: 22.2 % — ABNORMAL LOW (ref 36.0–46.0)
HCT: 32.2 % — ABNORMAL LOW (ref 36.0–46.0)
Hemoglobin: 6.9 g/dL — CL (ref 12.0–15.0)
Hemoglobin: 10.5 g/dL — ABNORMAL LOW (ref 12.0–15.0)
MCH: 29 pg (ref 26.0–34.0)
MCH: 29.2 pg (ref 26.0–34.0)
MCHC: 31.1 g/dL (ref 30.0–36.0)
MCHC: 32.6 g/dL (ref 30.0–36.0)
MCV: 93.3 fL (ref 80.0–100.0)
MCV: 89.7 fL (ref 80.0–100.0)
Platelets: 168 10*3/uL (ref 150–400)
Platelets: 183 10*3/uL (ref 150–400)
RBC: 2.38 MIL/uL — ABNORMAL LOW (ref 3.87–5.11)
RBC: 3.59 MIL/uL — ABNORMAL LOW (ref 3.87–5.11)
RDW: 14.9 % (ref 11.5–15.5)
RDW: 14.4 % (ref 11.5–15.5)
WBC: 13.4 10*3/uL — ABNORMAL HIGH (ref 4.0–10.5)
WBC: 14.3 10*3/uL — ABNORMAL HIGH (ref 4.0–10.5)
nRBC: 0.4 % — ABNORMAL HIGH (ref 0.0–0.2)
nRBC: 0.2 % (ref 0.0–0.2)

## 2019-01-18 LAB — PREPARE RBC (CROSSMATCH)

## 2019-01-18 MED ORDER — METHYLERGONOVINE MALEATE 0.2 MG PO TABS
0.2000 mg | ORAL_TABLET | Freq: Four times a day (QID) | ORAL | Status: DC
  Administered 2019-01-19 (×2): 0.2 mg via ORAL
  Filled 2019-01-18 (×2): qty 1

## 2019-01-18 MED ORDER — SODIUM CHLORIDE 0.9% IV SOLUTION: Freq: Once | INTRAVENOUS | Status: DC

## 2019-01-18 MED ORDER — METHYLERGONOVINE MALEATE 0.2 MG PO TABS: 0.2000 mg | ORAL_TABLET | Freq: Four times a day (QID) | ORAL | Status: DC

## 2019-01-18 MED ORDER — METHYLERGONOVINE MALEATE 0.2 MG/ML IJ SOLN
0.2000 mg | Freq: Four times a day (QID) | INTRAMUSCULAR | Status: DC
  Administered 2019-01-18: 0.2 mg via INTRAMUSCULAR
  Filled 2019-01-18: qty 1

## 2019-01-18 NOTE — Lactation Note (Signed)
This note was copied from a baby's chart. Lactation Consultation Note  Patient Name: Kelsey Koch GLOVF'I Date: 01/18/2019 Reason for consult: Follow-up assessment;Other (Comment);Primapara;1st time breastfeeding;Term;Infant weight loss(MBURN SusieBurns at the bedside setting up for mom to receive 2 units of blood due to low Hgb.)  Mom pale in color and the MBURN expressed mom is very tired and plans to rest baby in the nursery.  Last 2 feedings were from a bottle - 10 and 15 ml .  LC recommended rest for mom , and when she is re- latching to call  For assistance.  DEBP for post pumping is indicated due to blood loss, especially since Mom will be transfused with 2 units of blood.  MBURN aware when mom is feeling more rested DEBP to be set up.  Mom aware.    Maternal Data    Feeding Feeding Type: (per Miguel Rota baby is in the nursery due to mom being exhausted and having to receive blood x 2 units) Nipple Type: Slow - flow  LATCH Score                   Interventions Interventions: Breast feeding basics reviewed  Lactation Tools Discussed/Used Tools: Pump(LC asked Susie to report to the RN following her to set up a DEBP for extra pumping after blood transfusion)   Consult Status Consult Status: Follow-up Date: 01/19/19 Follow-up type: In-patient    [place] 01/18/2019, 2:59 PM

## 2019-01-18 NOTE — Progress Notes (Signed)
Notified teaching service of patient's critical lab value of 6.9 hgb, received from lab

## 2019-01-18 NOTE — Clinical Social Work Maternal (Signed)
CLINICAL SOCIAL WORK MATERNAL/CHILD NOTE  Patient Details  Name: Kelsey Koch MRN: [id number] Date of Birth: 1995/08/03  Date:  01/18/2019  Clinical Social Worker Initiating Note:  Laurey Arrow Date/Time: Initiated:  01/18/19/1231     Child's Name:  Kelsey Koch   Biological Parents:  Mother, Father   Need for Interpreter:  None   Reason for Referral:  Behavioral Health Concerns(hx Bipolar)   Address:  [address] [address]    Phone number:  [phone] (home)     Additional phone number:  Household Members/Support Persons (HM/SP):   (MOB resides with a roomate.)   HM/SP Name Relationship DOB or Age  HM/SP -1        HM/SP -2        HM/SP -3        HM/SP -4        HM/SP -5        HM/SP -6        HM/SP -7        HM/SP -8          Natural Supports (not living in the home):  Immediate Family, Friends, Extended Family, Parent, Spouse/significant other   Professional Supports: Therapist(MOB receives outpatient services with [facility], general practice.)   Employment:     Type of Work:     Education:  Programmer, systems   Homebound arranged:    Museum/gallery curator Resources:  [employer]   Other Resources:  (CSW provided MOB with information to apply for Physicist, medical and [place].)   Cultural/Religious Considerations Which May Impact Care:  None reported  Strengths:  Ability to meet basic needs , Psychotropic Medications, Compliance with medical plan , Pediatrician chosen, Home prepared for child , Understanding of illness   Psychotropic Medications:  Lamictal      Pediatrician:    (Infant will follow-up care with [facility] .)  Pediatrician List:   [facility]      Pediatrician Fax Number:    Risk Factors/Current Problems:      Cognitive State:  Alert , Insightful , Linear Thinking , Goal Oriented    Mood/Affect:  Happy , Bright , Comfortable ,  Interested    CSW Assessment: CSW met with MOB in room 414 to complete an assessment for MH hx.  When CSW arrived, MOB was bonding with infant as evidence by engaging in skin to skin. FOB was also present and was asleep on the couch during the clinical assessment (FOB did not wake to participate in the assessment).  CSW explained CSW's role and MOB gave CSW permission to complete the assessment while FOB was present. MOB was polite, easy to engage, and receptive to meeting with CSW.  CSW asked about MOB's MH hx and MOB openly shared her recent dx of Bipolar disorder.  MOB reported that she recall having symptoms since around age 23 however was never dx.  MOB communicated that she is currently receiving outpatient counseling and psychiatric care at [facility] and her symptoms are being managed with medication (Lamictal). CSW provided education regarding the baby blues period vs. perinatal mood disorders, discussed treatment and gave resources for mental health follow up if concerns arise.  CSW recommends self-evaluation during the postpartum time period using the New Mom Checklist from Postpartum Progress and encouraged MOB to contact a medical professional if symptoms are  noted at any time.  MOB did not present with any acute symptoms and appeared to have insight and awareness.  MOB reported having a good support team and shared that she feels comfortable seeking help if help is warranted.  CSW assessed for safety and MOB denied SI, HI, and DV.   Per MOB, MOB has all essential items to care for infant and she feels prepared to parent.   CSW provided review of Sudden Infant Death Syndrome (SIDS) precautions.    CSW identifies no further need for intervention and no barriers to discharge at this time.  CSW Plan/Description:  Psychosocial Support and Ongoing Assessment of Needs, Sudden Infant Death Syndrome (SIDS) Education, Perinatal Mood and Anxiety Disorder (PMADs) Education   Laurey Arrow, MSW,  LCSW Clinical Social Work [phone]  Dimple Nanas, LCSW 01/18/2019, 12:35 PM

## 2019-01-18 NOTE — Progress Notes (Addendum)
POSTPARTUM PROGRESS NOTE  Post Partum Day #1  Subjective:  Kelsey Koch is a 23 y.o. G1P0 s/p NSVD at [redacted]w[redacted]d.  She reports she is doing well. She does endorse lightheadedness and some chest discomfort when she stands up. She notes her bleeding has been heavy but is starting to improve now. Hemoglobin noted to be 6.9 this AM. She denies any problems with ambulating, voiding or po intake. Denies nausea or vomiting.  Pain is well controlled.  Lochia is improving, now moderate.  Objective: Blood pressure 115/64, pulse 79, temperature (!) 97.5 F (36.4 C), temperature source Oral, resp. rate 18, height 5\' 4"  (1.626 m), weight 86.2 kg, last menstrual period 04/11/2018, SpO2 98 %.  Physical Exam:  General: alert, cooperative and no distress Chest: no respiratory distress Heart:regular rate, distal pulses intact Abdomen: soft, nontender,  Uterine Fundus: firm, appropriately tender DVT Evaluation: No calf swelling or tenderness Extremities: No LE edema Skin: warm, dry  Recent Labs    01/16/19 0806 01/18/19 0416  HGB 10.2* 6.9*  HCT 32.8* 22.2*    Assessment/Plan: Kelsey Koch is a 23 y.o. G1P0 s/p NSVD at 110w2d   PPD#1 - Doing well  Routine postpartum care  Symptomatic Anemia: Hgb 6.9 this AM, down from 10.2 prior to delivery. Only 181mL out during delivery per delivery note. Endorses symptoms of lightheadedness. Patient is amendable to blood transfusion.  - transfuse 1U pRBC - follow up post transfusion H&H - monitor lochia  - recommend iron supplement upon discharge - Methergine 0.2mg  IM x 1  Contraception: Depo at [facility] visit Feeding: Breast Dispo: Plan for discharge tomorrow if anemia is improved.   LOS: 2 days   Mina Marble, D.O. [place], PGY2 01/18/2019, 7:06 AM  OB FELLOW ATTESTATION  I have seen and examined this patient and agree with above documentation in the resident's note except as noted below.  Agree w above  Augustin Coupe,  MD/MPH OB Fellow  01/18/2019, 8:05 AM

## 2019-01-18 NOTE — Progress Notes (Signed)
CRITICAL VALUE ALERT  Critical Value:  6.9  Date & Time Notied:  0515  01/18/19  Provider Notified: y    Orders Received/Actions taken: none at this time

## 2019-01-19 ENCOUNTER — Encounter (HOSPITAL_COMMUNITY): Payer: Self-pay | Admitting: *Deleted

## 2019-01-19 ENCOUNTER — Other Ambulatory Visit: Payer: Self-pay

## 2019-01-19 LAB — TYPE AND SCREEN
ABO/RH(D): A POS
Antibody Screen: NEGATIVE
Unit division: 0
Unit division: 0

## 2019-01-19 LAB — COMPREHENSIVE METABOLIC PANEL
ALT: 17 U/L (ref 0–44)
AST: 37 U/L (ref 15–41)
Albumin: 2.1 g/dL — ABNORMAL LOW (ref 3.5–5.0)
Alkaline Phosphatase: 262 U/L — ABNORMAL HIGH (ref 38–126)
Anion gap: 9 (ref 5–15)
BUN: 9 mg/dL (ref 6–20)
CO2: 21 mmol/L — ABNORMAL LOW (ref 22–32)
Calcium: 8.7 mg/dL — ABNORMAL LOW (ref 8.9–10.3)
Chloride: 108 mmol/L (ref 98–111)
Creatinine, Ser: 0.85 mg/dL (ref 0.44–1.00)
GFR calc Af Amer: >60 mL/min (ref >60)
GFR calc non Af Amer: >60 mL/min (ref >60)
Glucose, Bld: 83 mg/dL (ref 70–99)
Potassium: 4.2 mmol/L (ref 3.5–5.1)
Sodium: 138 mmol/L (ref 135–145)
Total Bilirubin: 0.7 mg/dL (ref 0.3–1.2)
Total Protein: 5 g/dL — ABNORMAL LOW (ref 6.5–8.1)

## 2019-01-19 LAB — BPAM RBC
Blood Product Expiration Date: 202010242359
Blood Product Expiration Date: 202010242359
ISSUE DATE / TIME: 202010041047
ISSUE DATE / TIME: 202010041520
Unit Type and Rh: 6200
Unit Type and Rh: 6200

## 2019-01-19 LAB — CBC
HCT: 30.9 % — ABNORMAL LOW (ref 36.0–46.0)
Hemoglobin: 10.3 g/dL — ABNORMAL LOW (ref 12.0–15.0)
MCH: 29.9 pg (ref 26.0–34.0)
MCHC: 33.3 g/dL (ref 30.0–36.0)
MCV: 89.6 fL (ref 80.0–100.0)
Platelets: 173 10*3/uL (ref 150–400)
RBC: 3.45 MIL/uL — ABNORMAL LOW (ref 3.87–5.11)
RDW: 14.6 % (ref 11.5–15.5)
WBC: 12.7 10*3/uL — ABNORMAL HIGH (ref 4.0–10.5)
nRBC: 0.2 % (ref 0.0–0.2)

## 2019-01-19 MED ORDER — IBUPROFEN 600 MG PO TABS: 600.0000 mg | ORAL_TABLET | Freq: Four times a day (QID) | ORAL | 0 refills | Status: DC

## 2019-01-19 MED ORDER — TETANUS-DIPHTH-ACELL PERTUSSIS 5-2.5-18.5 LF-MCG/0.5 IM SUSP: 0.5000 mL | Freq: Once | INTRAMUSCULAR | 0 refills | Status: AC

## 2019-01-19 MED ORDER — SENNOSIDES-DOCUSATE SODIUM 8.6-50 MG PO TABS: 2.0000 | ORAL_TABLET | ORAL | 0 refills | Status: DC

## 2019-01-19 NOTE — Lactation Note (Addendum)
This note was copied from a baby's chart. Lactation Consultation Note  Patient Name: Kelsey Koch SAYTK'Z Date: 01/19/2019 Reason for consult: Follow-up assessment   Baby 60 hours old and cueing.  Mother has abrasions on tips of nipples. For soreness suggest mother apply ebm or coconut oil while wearing shells and alternate with comfort gels. Assisted mother with latching in cross cradle hold demonstrated how to compress breast to achieve depth and performed chin tug to widen latch.  Mother states latch is tender and requested a nipple shield. Applied #24NS and mother continued to breastfeed on L breast. She states it still hurts at times and is unsure if she will continue using it. Advised mother if she is using NS to post pump after every other feeding and give volume back to baby at next feeding. Mother has manual pump in room.  Advised post pumping 10 min per side.  Mother states she tried formula and her baby does not like it. Mother has personal DEBP at home.  Could not remember brand. Feed on demand with cues.  Goal 8-12+ times per day after first 24 hrs.  Place baby STS if not cueing.  Reviewed engorgement care and monitoring voids/stools.     Maternal Data    Feeding Feeding Type: Breast Fed  LATCH Score Latch: Grasps breast easily, tongue down, lips flanged, rhythmical sucking.  Audible Swallowing: A few with stimulation  Type of Nipple: Everted at rest and after stimulation  Comfort (Breast/Nipple): Filling, red/small blisters or bruises, mild/mod discomfort  Hold (Positioning): Assistance needed to correctly position infant at breast and maintain latch.  LATCH Score: 7  Interventions Interventions: Breast feeding basics reviewed;Assisted with latch;Breast compression;Coconut oil;Shells;Comfort gels;Hand pump  Lactation Tools Discussed/Used Tools: Pump;Nipple Shields Nipple shield size: 24 Breast pump type: Manual   Consult Status       Carlye Grippe 01/19/2019, 9:19 AM

## 2019-01-19 NOTE — Discharge Instructions (Signed)

## 2019-01-19 NOTE — Lactation Note (Signed)
This note was copied from a baby's chart. Lactation Consultation Note Went to visit mom. Mom not feeling well. RN with mom.  Patient Name: Kelsey Koch OXBDZ'H Date: 01/19/2019     Maternal Data    Feeding    LATCH Score                   Interventions    Lactation Tools Discussed/Used     Consult Status      Theodoro Kalata 01/19/2019, 4:53 AM

## 2019-01-19 NOTE — Progress Notes (Signed)
Postpartum Interim Progress Note  S:Received page from nurse stating patient complained of chest pain when she stood up. She endorsed similar discomfort the morning prior. She notes the pain is located along the sternum and left breast. She describes the pain as "pressure, squeezing". She endorses currently feeling the pain now. She notes at onset of the chest pain she also developed a headache and nausea. She states she passed several large clots just earlier this morning. Denies any SOB or significant leg swelling. She notes she has a history of intermittent episodes of arrhythmia, otherwise no significant CV history.  O: BP 129/85 (BP Location: Left Arm)   Pulse 74   Temp 99.1 F (37.3 C) (Oral)   Resp 16   Ht 5\' 4"  (1.626 m)   Wt 86.2 kg   LMP 04/11/2018 (Exact Date)   SpO2 99%   BMI 32.61 kg/m   Gen: well appearing, sitting up comfortably in bed with baby and significant other at bedside Resp: Speaking in full sentences, breathing comfortably on room air MSK: Nontender to palpation  A/P: Atypical chest pain: Unclear etiology. Non-reproducable to palpation thus unlikely MSK. EKG obtained an NSR without ST changes. Nonpleuritic and Vitals all within normal limits making PE less likely. No significant LE swelling or dyspnea to indicate cardiomyopathy. Has continued to pass clots s/p 2U pRBC and Methergine 0.2mg  QID. - obtain CBC, CMP to evaluate hemoglobin, electrolytes - consider echo  - will plan to discuss further with team  Danna Hefty, DO 01/19/2019, 5:17 AM PGY-2, [facility] pager [phone]

## 2019-02-26 ENCOUNTER — Encounter: Payer: Self-pay | Admitting: Family Medicine

## 2019-02-26 ENCOUNTER — Ambulatory Visit (INDEPENDENT_AMBULATORY_CARE_PROVIDER_SITE_OTHER): Payer: Medicaid Other | Admitting: Family Medicine

## 2019-02-26 ENCOUNTER — Other Ambulatory Visit: Payer: Self-pay

## 2019-02-26 DIAGNOSIS — Z1389 Encounter for screening for other disorder: Secondary | ICD-10-CM | POA: Diagnosis not present

## 2019-02-26 NOTE — Progress Notes (Signed)
Post Partum Exam  Kelsey Koch is a 23 y.o. G60P1001 female who presents for a postpartum visit. She is 5 weeks postpartum following a spontaneous vaginal delivery. I have fully reviewed the prenatal and intrapartum course. The delivery was at 40 gestational weeks.  Anesthesia: epidural. Postpartum course has been uncomplicated. Baby's course has been uncomplicated. Baby is feeding by breast and bottle. Bleeding moderate lochia. Bowel function is normal. Bladder function is normal. Patient is not sexually active. Contraception method is Depo-Provera injections. Postpartum depression screening:neg  She does feel anxious and has restarted lamictal. She is going to make an appt with her psychiatrist.   Last pap smear not done (due to age). Patient desires to come back another time for pap smear since she is on her period now.  Review of System Pertinent items are noted in HPI.    Objective:  Last menstrual period 04/11/2018, unknown if currently breastfeeding.  General:  alert, cooperative and no distress  Lungs: clear to auscultation bilaterally  Heart:  regular rate and rhythm, S1, S2 normal, no murmur, click, rub or gallop  Abdomen: soft, non-tender; bowel sounds normal; no masses,  no organomegaly        Assessment:    Normal postpartum exam. Pap smear not done at today's visit.   Plan:   1. Contraception: Depo-Provera injections - start in 2-3 months 2. Follow up in: 2 months or as needed.

## 2019-03-01 DIAGNOSIS — Z20828 Contact with and (suspected) exposure to other viral communicable diseases: Secondary | ICD-10-CM | POA: Diagnosis not present

## 2019-03-01 DIAGNOSIS — N61 Mastitis without abscess: Secondary | ICD-10-CM | POA: Diagnosis not present

## 2019-03-01 DIAGNOSIS — R509 Fever, unspecified: Secondary | ICD-10-CM | POA: Diagnosis not present

## 2019-03-01 DIAGNOSIS — L539 Erythematous condition, unspecified: Secondary | ICD-10-CM | POA: Diagnosis not present

## 2019-04-23 ENCOUNTER — Encounter: Payer: Self-pay | Admitting: Family Medicine

## 2019-04-23 ENCOUNTER — Other Ambulatory Visit (HOSPITAL_COMMUNITY)
Admission: RE | Admit: 2019-04-23 | Discharge: 2019-04-23 | Disposition: A | Discharge status: Home / Self Care | Payer: Medicaid Other | Source: Ambulatory Visit | Attending: Family Medicine | Admitting: Family Medicine

## 2019-04-23 ENCOUNTER — Ambulatory Visit (INDEPENDENT_AMBULATORY_CARE_PROVIDER_SITE_OTHER): Payer: Medicaid Other | Admitting: Family Medicine

## 2019-04-23 ENCOUNTER — Other Ambulatory Visit: Payer: Self-pay

## 2019-04-23 VITALS — BP 117/62 | HR 84 | Ht 65.0 in | Wt 180.0 lb

## 2019-04-23 DIAGNOSIS — Z01419 Encounter for gynecological examination (general) (routine) without abnormal findings: Secondary | ICD-10-CM | POA: Insufficient documentation

## 2019-04-23 DIAGNOSIS — Z3202 Encounter for pregnancy test, result negative: Secondary | ICD-10-CM

## 2019-04-23 DIAGNOSIS — Z30013 Encounter for initial prescription of injectable contraceptive: Secondary | ICD-10-CM

## 2019-04-23 DIAGNOSIS — Z0000 Encounter for general adult medical examination without abnormal findings: Secondary | ICD-10-CM | POA: Diagnosis not present

## 2019-04-23 LAB — POCT URINE PREGNANCY: Preg Test, Ur: NEGATIVE

## 2019-04-23 MED ORDER — MEDROXYPROGESTERONE ACETATE 150 MG/ML IM SUSP
150.0000 mg | Freq: Once | INTRAMUSCULAR | Status: AC
  Administered 2019-04-23: 150 mg via INTRAMUSCULAR

## 2019-04-23 MED ORDER — MEDROXYPROGESTERONE ACETATE 150 MG/ML IM SUSP: 150.0000 mg | INTRAMUSCULAR | 0 refills | Status: DC

## 2019-04-23 NOTE — Progress Notes (Signed)
GYNECOLOGY ANNUAL PREVENTATIVE CARE ENCOUNTER NOTE  Subjective:   Kelsey Koch is a 24 y.o. G29P1001 female here for a routine annual gynecologic exam.  Current complaints: none.   Denies abnormal vaginal bleeding, discharge, pelvic pain, problems with intercourse or other gynecologic concerns.    Gynecologic History Patient's last menstrual period was 04/17/2019. Patient is  sexually active  Contraception: condoms Last Pap: n/a  Obstetric History OB History  Gravida Para Term Preterm AB Living  1 1 1     1   SAB TAB Ectopic Multiple Live Births        0 1    # Outcome Date GA Lbr Len/2nd Weight Sex Delivery Anes PTL Lv  1 Term 01/17/19 [redacted]w[redacted]d 23:12 / 00:46 8 lb 8.3 oz (3.865 kg) F Vag-Spont EPI  LIV    Past Medical History:  Diagnosis Date  . Medical history non-contributory     Past Surgical History:  Procedure Laterality Date  . NO PAST SURGERIES      Current Outpatient Medications on File Prior to Visit  Medication Sig Dispense Refill  . ibuprofen (ADVIL) 600 MG tablet Take 1 tablet (600 mg total) by mouth every 6 (six) hours. (Patient not taking: Reported on 04/23/2019) 30 tablet 0  . lamoTRIgine (LAMICTAL) 25 MG tablet Take 25 mg by mouth daily.    . Prenatal Vit-Fe Fumarate-FA (PRENATAL VITAMINS PO) Take by mouth.    . senna-docusate (SENOKOT-S) 8.6-50 MG tablet Take 2 tablets by mouth daily. (Patient not taking: Reported on 04/23/2019) 30 tablet 0   No current facility-administered medications on file prior to visit.    Allergies  Allergen Reactions  . Levonorgestrel   . Ethinyl Estradiol Other (See Comments)    Agitated   . Kiwi Extract Itching  . Onion Itching    Social History   Socioeconomic History  . Marital status: Single    Spouse name: Not on file  . Number of children: Not on file  . Years of education: Not on file  . Highest education level: Not on file  Occupational History  . Not on file  Tobacco Use  . Smoking status: Former  Smoker    Packs/day: 0.25    Years: 3.00    Pack years: 0.75    Types: Cigarettes    Quit date: 02/2018    Years since quitting: 1.1  . Smokeless tobacco: Never Used  Substance and Sexual Activity  . Alcohol use: Not Currently    Alcohol/week: 2.0 standard drinks    Types: 2 Glasses of wine per week  . Drug use: Never  . Sexual activity: Yes    Birth control/protection: None  Other Topics Concern  . Not on file  Social History Narrative  . Not on file   Social Determinants of Health   Financial Resource Strain: Low Risk   . Difficulty of Paying Living Expenses: Not hard at all  Food Insecurity: No Food Insecurity  . Worried About 03/2018 in the Last Year: Never true  . Ran Out of Food in the Last Year: Never true  Transportation Needs: No Transportation Needs  . Lack of Transportation (Medical): No  . Lack of Transportation (Non-Medical): No  Physical Activity: Inactive  . Days of Exercise per Week: 0 days  . Minutes of Exercise per Session: 0 min  Stress: Stress Concern Present  . Feeling of Stress : To some extent  Social Connections:   . Frequency of Communication with Friends  and Family: Not on file  . Frequency of Social Gatherings with Friends and Family: Not on file  . Attends Religious Services: Not on file  . Active Member of Clubs or Organizations: Not on file  . Attends Archivist Meetings: Not on file  . Marital Status: Not on file  Intimate Partner Violence: Unknown  . Fear of Current or Ex-Partner: Patient refused  . Emotionally Abused: Patient refused  . Physically Abused: Patient refused  . Sexually Abused: Patient refused    Family History  Problem Relation Age of Onset  . Ovarian cysts Mother   . Arthritis Mother     The following portions of the patient's history were reviewed and updated as appropriate: allergies, current medications, past family history, past medical history, past social history, past surgical history  and problem list.  Review of Systems Pertinent items are noted in HPI.   Objective:  BP 117/62   Pulse 84   Ht 5\' 5"  (1.651 m)   Wt 180 lb (81.6 kg)   LMP 04/17/2019   Breastfeeding No   BMI 29.95 kg/m  Wt Readings from Last 3 Encounters:  04/23/19 180 lb (81.6 kg)  02/26/19 168 lb (76.2 kg)  01/16/19 190 lb (86.2 kg)     Chaperone present during exam  CONSTITUTIONAL: Well-developed, well-nourished female in no acute distress.  HENT:  Normocephalic, atraumatic, External right and left ear normal. Oropharynx is clear and moist EYES: Conjunctivae and EOM are normal. Pupils are equal, round, and reactive to light. No scleral icterus.  NECK: Normal range of motion, supple, no masses.  Normal thyroid.   CARDIOVASCULAR: Normal heart rate noted, regular rhythm RESPIRATORY: Clear to auscultation bilaterally. Effort and breath sounds normal, no problems with respiration noted. BREASTS: Symmetric in size. No masses, skin changes, nipple drainage, or lymphadenopathy. ABDOMEN: Soft, normal bowel sounds, no distention noted.  No tenderness, rebound or guarding.  PELVIC: Normal appearing external genitalia; normal appearing vaginal mucosa and cervix.  No abnormal discharge noted.  Normal uterine size, no other palpable masses, no uterine or adnexal tenderness. MUSCULOSKELETAL: Normal range of motion. No tenderness.  No cyanosis, clubbing, or edema.  2+ distal pulses. SKIN: Skin is warm and dry. No rash noted. Not diaphoretic. No erythema. No pallor. NEUROLOGIC: Alert and oriented to person, place, and time. Normal reflexes, muscle tone coordination. No cranial nerve deficit noted. PSYCHIATRIC: Normal mood and affect. Normal behavior. Normal judgment and thought content.  Assessment:  Annual gynecologic examination with pap smear   Plan:  1. Well Woman Exam Will follow up results of pap smear and manage accordingly. STD testing discussed. Patient declined testing   Routine preventative  health maintenance measures emphasized. Please refer to After Visit Summary for other counseling recommendations.    Loma [place], [place] for [employer]

## 2019-04-28 ENCOUNTER — Encounter: Payer: Self-pay | Admitting: Family Medicine

## 2019-04-28 DIAGNOSIS — R87612 Low grade squamous intraepithelial lesion on cytologic smear of cervix (LGSIL): Secondary | ICD-10-CM | POA: Insufficient documentation

## 2019-04-28 LAB — CYTOLOGY - PAP

## 2019-06-24 DIAGNOSIS — Z20828 Contact with and (suspected) exposure to other viral communicable diseases: Secondary | ICD-10-CM | POA: Diagnosis not present

## 2019-06-24 DIAGNOSIS — U071 COVID-19: Secondary | ICD-10-CM | POA: Diagnosis not present

## 2019-07-23 ENCOUNTER — Other Ambulatory Visit: Payer: Self-pay

## 2019-07-23 ENCOUNTER — Ambulatory Visit (INDEPENDENT_AMBULATORY_CARE_PROVIDER_SITE_OTHER): Payer: Medicaid Other

## 2019-07-23 VITALS — BP 132/95 | HR 104

## 2019-07-23 DIAGNOSIS — Z3042 Encounter for surveillance of injectable contraceptive: Secondary | ICD-10-CM | POA: Diagnosis not present

## 2019-07-23 MED ORDER — MEDROXYPROGESTERONE ACETATE 150 MG/ML IM SUSP
150.0000 mg | Freq: Once | INTRAMUSCULAR | Status: AC
  Administered 2019-07-23: 150 mg via INTRAMUSCULAR

## 2019-07-23 NOTE — Progress Notes (Signed)
Patient presents for depo provera. Armandina Stammer RN

## 2019-10-02 DIAGNOSIS — R300 Dysuria: Secondary | ICD-10-CM | POA: Diagnosis not present

## 2019-10-02 DIAGNOSIS — N3001 Acute cystitis with hematuria: Secondary | ICD-10-CM | POA: Diagnosis not present

## 2019-10-08 ENCOUNTER — Ambulatory Visit: Payer: Medicaid Other

## 2020-03-03 DIAGNOSIS — U071 COVID-19: Secondary | ICD-10-CM | POA: Diagnosis not present

## 2020-08-22 IMAGING — US US MFM OB DETAIL +14 WK
1 series · 13 of 28 positions shown · non-contrast
Comparison: none

[Series 1: us mfm ob detail +14 wk · 97 acquisitions, 13 frames shown]
[im 4/97]
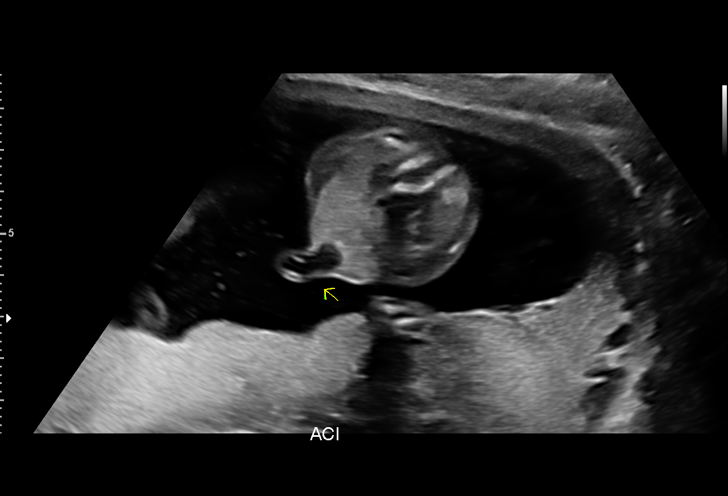
[im 11/97]
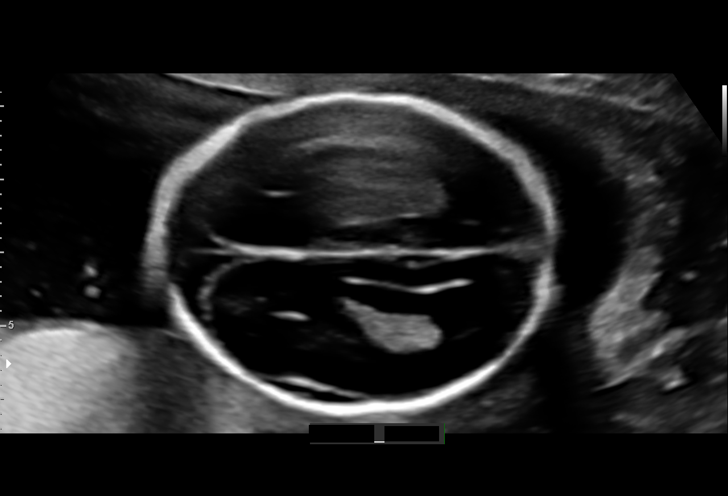
[im 18/97]
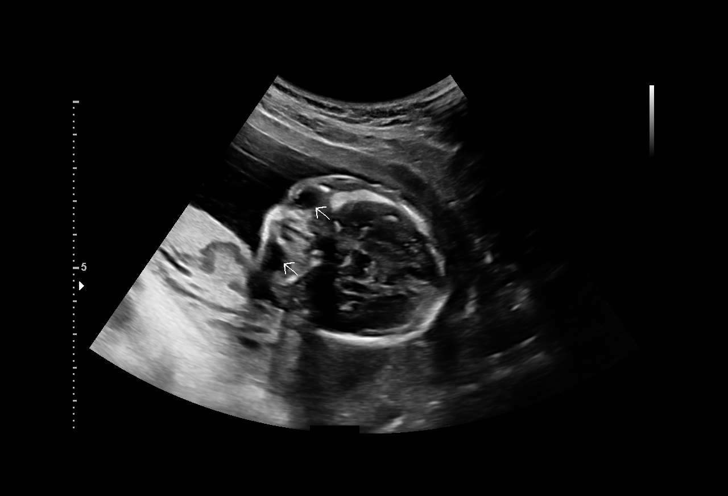
[im 25/97]
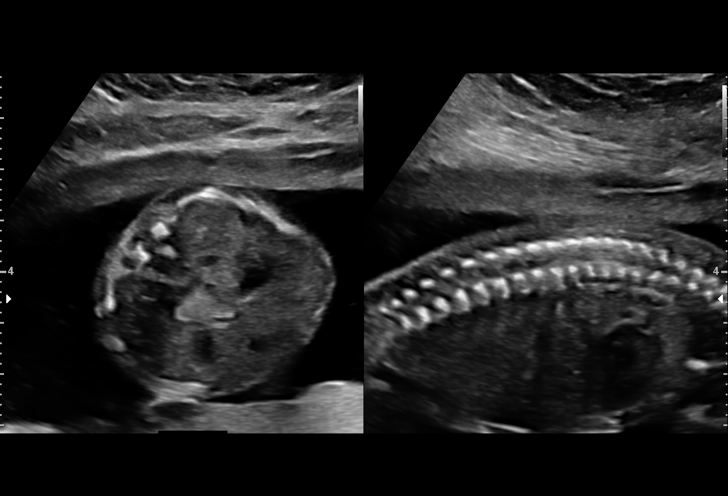
[im 33/97]
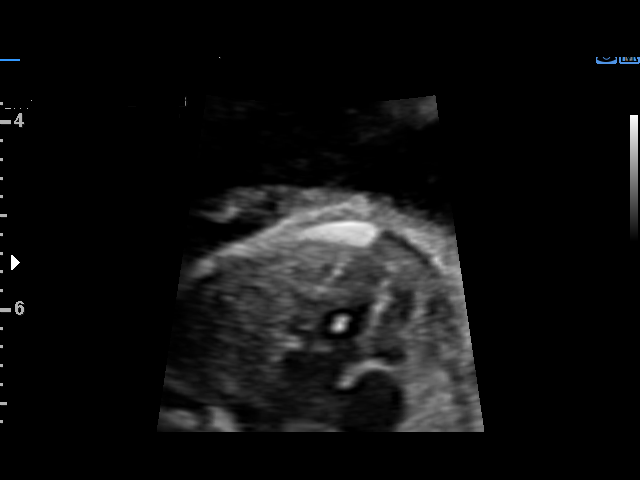
[im 40/97]
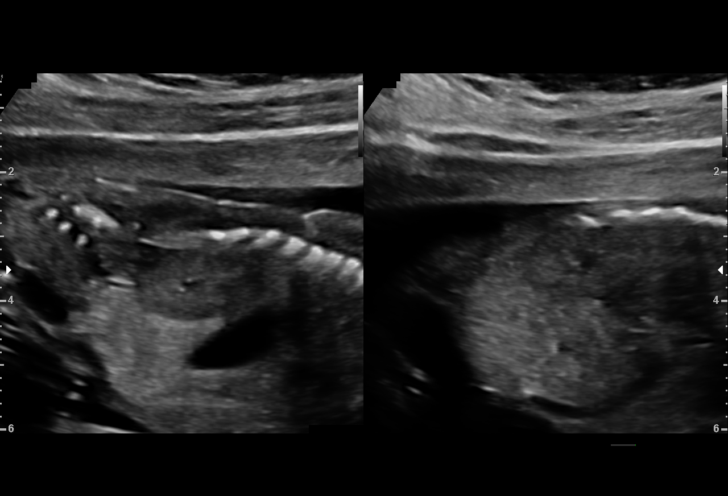
[im 50/97]
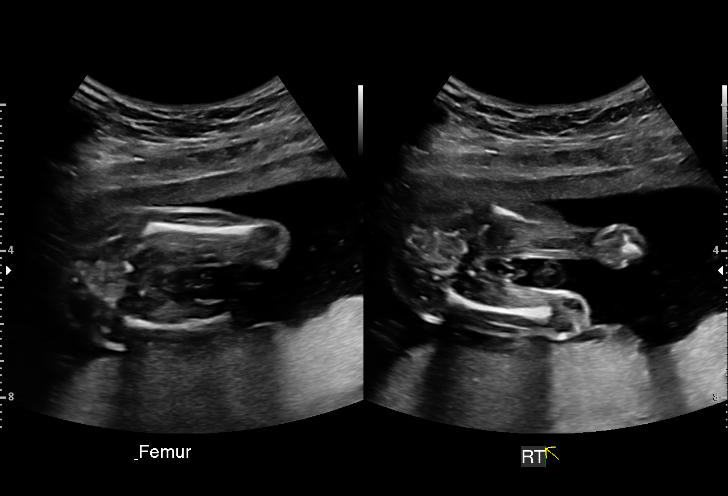
[im 57/97]
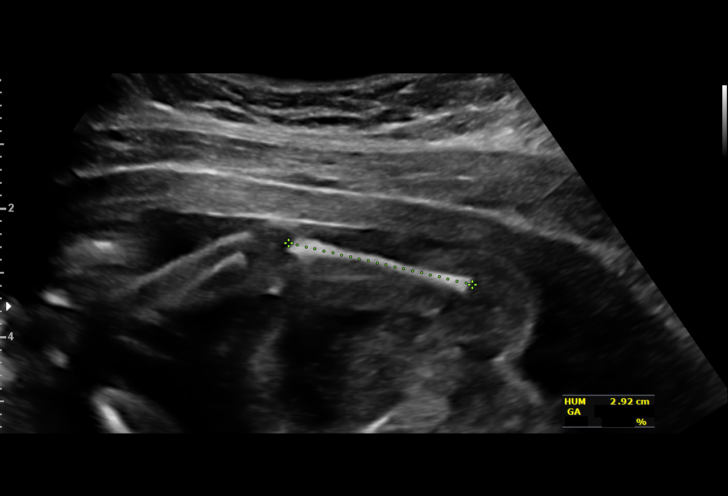
[im 65/97]
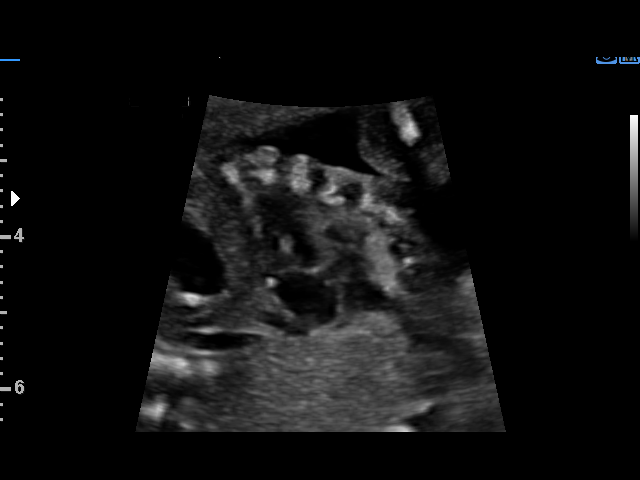
[im 72/97]
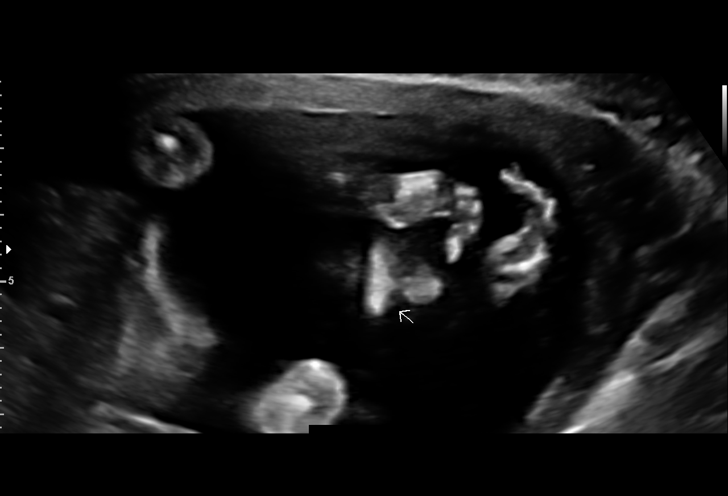
[im 79/97]
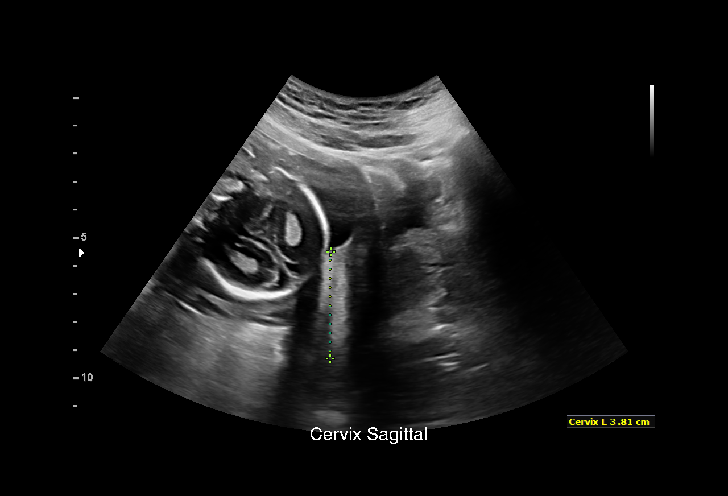
[im 86/97]
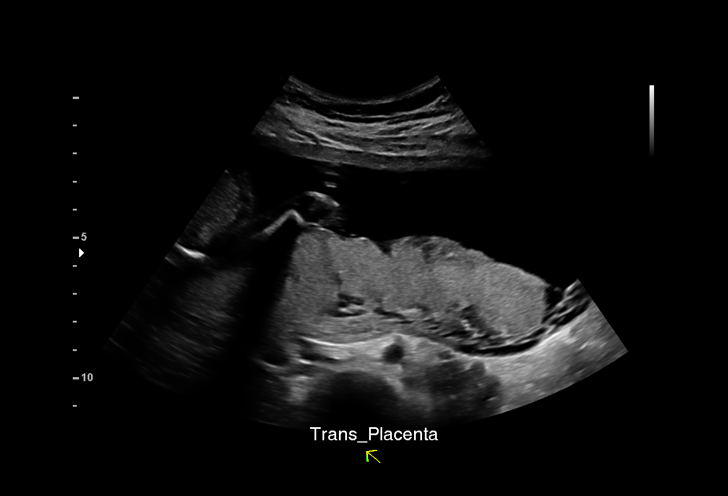
[im 93/97]
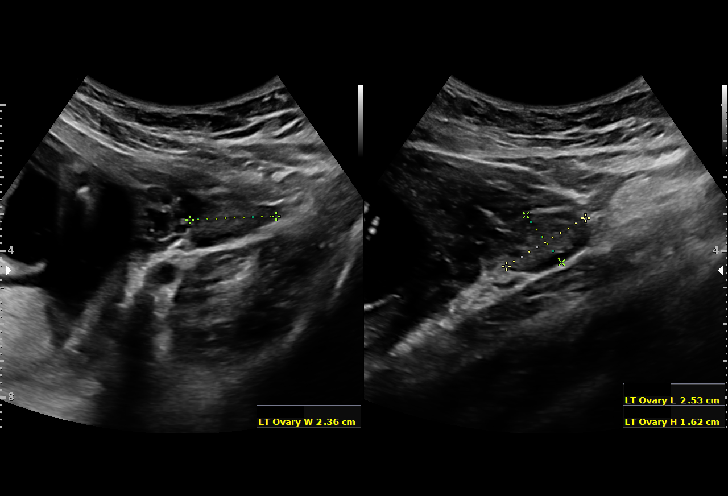

[13 of 28 positions shown; findings below may reference images not displayed]

[REDACTED]
                   JARA CNM

 ----------------------------------------------------------------------

 ----------------------------------------------------------------------
Indications

  Fetal abnormality - other known or
  suspected (EIFLV)
  Encounter for antenatal screening for
  malformations (Low risk NIPS, AFP neg, CF
  neg)
  19 weeks gestation of pregnancy
 ----------------------------------------------------------------------
Vital Signs

 BMI:
Fetal Evaluation

 Num Of Fetuses:         1
 Fetal Heart Rate(bpm):  132
 Cardiac Activity:       Observed
 Presentation:           Transverse, head to maternal left
 Placenta:               Posterior
 P. Cord Insertion:      Visualized

 Amniotic Fluid
 AFI FV:      Within normal limits

                             Largest Pocket(cm)

Biometry

 BPD:      43.5  mm     G. Age:  19w 1d         58  %    CI:        69.61   %    70 - 86
                                                         FL/HC:      17.7   %    16.1 -
 HC:      166.4  mm     G. Age:  19w 2d         58  %    HC/AC:      1.24        1.09 -
 AC:      134.4  mm     G. Age:  19w 0d         43  %    FL/BPD:     67.8   %
 FL:       29.5  mm     G. Age:  19w 1d         46  %    FL/AC:      21.9   %    20 - 24
 HUM:      28.8  mm     G. Age:  19w 2d         59  %
 CER:      19.3  mm     G. Age:  18w 5d         40  %
 NFT:       2.8  mm
 LV:        6.7  mm
 CM:        4.3  mm

 Est. FW:     270  gm    0 lb 10 oz      45  %
OB History

 Gravidity:    1
Gestational Age

 LMP:           19w 0d        Date:  04/11/18                 EDD:   01/16/19
 U/S Today:     19w 1d                                        EDD:   01/15/19
 Best:          19w 0d     Det. By:  LMP  (04/11/18)          EDD:   01/16/19
Anatomy

 Cranium:               Appears normal         LVOT:                   Appears normal
 Cavum:                 Appears normal         Aortic Arch:            Appears normal
 Ventricles:            Appears normal         Ductal Arch:            Appears normal
 Choroid Plexus:        Appears normal         Diaphragm:              Appears normal
 Cerebellum:            Appears normal         Stomach:                Appears normal, left
                                                                       sided
 Posterior Fossa:       Appears normal         Abdomen:                Appears normal
 Nuchal Fold:           Appears normal         Abdominal Wall:         Appears nml (cord
                                                                       insert, abd wall)
 Face:                  Appears normal         Cord Vessels:           Appears normal (3
                        (orbits and profile)                           vessel cord)
 Lips:                  Appears normal         Kidneys:                Appear normal
 Palate:                Not well visualized    Bladder:                Appears normal
 Thoracic:              Appears normal         Spine:                  Appears normal
 Heart:                 Echogenic focus        Upper Extremities:      Appears normal
                        in LV
 RVOT:                  Appears normal         Lower Extremities:      Appears normal

 Other:  Fetus appears to be female. Open hands visualized. Heels and 5th
         digit visualized.  Technically difficult due to fetal position.
Cervix Uterus Adnexa

 Cervix
 Length:           3.81  cm.
 Normal appearance by transabdominal scan.

 Uterus
 No abnormality visualized.

 Left Ovary
 Size(cm)       2.5  x   1.6    x  2.4       Vol(ml):
 Within normal limits.

 Right Ovary
 Size(cm)       3.3  x   1.9    x  1.9       Vol(ml):
 Within normal limits.
Impression

 We performed fetal anatomy scan. An echogenic intracardiac
 focus is seen. No other makers of aneuploidies or fetal
 structural defects are seen. Fetal biometry is consistent with
 her previously-established dates. Amniotic fluid is normal and
 good fetal activity is seen.

 On cell-free fetal DNA screening, the risks of fetal
 aneuploidies are not increased. MSAFP screening showed
 low risk for open-neural tube defects.
 I informed the patient that given that she had Rinuccia Tora for fetal
 aneuploidies on cell-free fetal DNA screening, finding of
 echogenic intracardiac focus should be considered a normal
 variant and that the risk of trisomy 21 is not increased. I also
 reassured that echogenic focus does not increase the risk of
 cardiac defects. I also informed her that only amniocentesis
 will give a defintive result on the fetal karyotype.
 Patient opted not to have amniocentesis.
Recommendations

 Follow-up as clinically indicated.
                 [place], Domik

## 2020-11-17 DIAGNOSIS — Z20822 Contact with and (suspected) exposure to covid-19: Secondary | ICD-10-CM | POA: Diagnosis not present

## 2021-07-31 DIAGNOSIS — N3000 Acute cystitis without hematuria: Secondary | ICD-10-CM | POA: Diagnosis not present

## 2021-12-02 DIAGNOSIS — R0789 Other chest pain: Secondary | ICD-10-CM | POA: Diagnosis not present

## 2021-12-02 DIAGNOSIS — R0689 Other abnormalities of breathing: Secondary | ICD-10-CM | POA: Diagnosis not present

## 2021-12-02 DIAGNOSIS — M6281 Muscle weakness (generalized): Secondary | ICD-10-CM | POA: Diagnosis not present

## 2021-12-04 DIAGNOSIS — S80212A Abrasion, left knee, initial encounter: Secondary | ICD-10-CM | POA: Diagnosis not present

## 2021-12-04 DIAGNOSIS — S60512A Abrasion of left hand, initial encounter: Secondary | ICD-10-CM | POA: Diagnosis not present

## 2021-12-04 DIAGNOSIS — Y999 Unspecified external cause status: Secondary | ICD-10-CM | POA: Diagnosis not present

## 2021-12-04 DIAGNOSIS — M25522 Pain in left elbow: Secondary | ICD-10-CM | POA: Diagnosis not present

## 2021-12-08 DIAGNOSIS — S83242D Other tear of medial meniscus, current injury, left knee, subsequent encounter: Secondary | ICD-10-CM | POA: Diagnosis not present

## 2021-12-19 DIAGNOSIS — M24122 Other articular cartilage disorders, left elbow: Secondary | ICD-10-CM | POA: Diagnosis not present

## 2022-01-11 DIAGNOSIS — S82002D Unspecified fracture of left patella, subsequent encounter for closed fracture with routine healing: Secondary | ICD-10-CM | POA: Diagnosis not present

## 2022-01-11 DIAGNOSIS — T148XXA Other injury of unspecified body region, initial encounter: Secondary | ICD-10-CM | POA: Diagnosis not present

## 2022-01-11 DIAGNOSIS — M25522 Pain in left elbow: Secondary | ICD-10-CM | POA: Diagnosis not present

## 2022-02-08 DIAGNOSIS — S82002D Unspecified fracture of left patella, subsequent encounter for closed fracture with routine healing: Secondary | ICD-10-CM | POA: Diagnosis not present

## 2022-02-08 DIAGNOSIS — S92505A Nondisplaced unspecified fracture of left lesser toe(s), initial encounter for closed fracture: Secondary | ICD-10-CM | POA: Diagnosis not present

## 2022-02-09 ENCOUNTER — Ambulatory Visit: Payer: Medicaid Other | Admitting: Family Medicine

## 2022-03-12 DIAGNOSIS — S92505A Nondisplaced unspecified fracture of left lesser toe(s), initial encounter for closed fracture: Secondary | ICD-10-CM | POA: Diagnosis not present

## 2022-03-12 DIAGNOSIS — S82002D Unspecified fracture of left patella, subsequent encounter for closed fracture with routine healing: Secondary | ICD-10-CM | POA: Diagnosis not present

## 2022-03-12 DIAGNOSIS — M79672 Pain in left foot: Secondary | ICD-10-CM | POA: Diagnosis not present

## 2022-03-28 ENCOUNTER — Encounter: Payer: Self-pay | Admitting: Family Medicine

## 2022-03-28 ENCOUNTER — Ambulatory Visit (INDEPENDENT_AMBULATORY_CARE_PROVIDER_SITE_OTHER): Payer: 59 | Admitting: Family Medicine

## 2022-03-28 ENCOUNTER — Other Ambulatory Visit (HOSPITAL_COMMUNITY)
Admission: RE | Admit: 2022-03-28 | Discharge: 2022-03-28 | Disposition: A | Discharge status: Home / Self Care | Payer: 59 | Source: Ambulatory Visit | Attending: Family Medicine | Admitting: Family Medicine

## 2022-03-28 VITALS — BP 119/70 | HR 75 | Ht 64.0 in | Wt 182.0 lb

## 2022-03-28 DIAGNOSIS — R87612 Low grade squamous intraepithelial lesion on cytologic smear of cervix (LGSIL): Secondary | ICD-10-CM

## 2022-03-28 DIAGNOSIS — Z01419 Encounter for gynecological examination (general) (routine) without abnormal findings: Secondary | ICD-10-CM | POA: Diagnosis not present

## 2022-03-28 DIAGNOSIS — F319 Bipolar disorder, unspecified: Secondary | ICD-10-CM

## 2022-03-28 NOTE — Progress Notes (Addendum)
    ANNUAL EXAM Patient name: Kelsey Koch MRN [id number]  Date of birth: 1996-04-12 Chief Complaint:   Annual Exam  History of Present Illness:   Kelsey Koch is a 26 y.o.  G68P1001  female  being seen today for a routine annual exam.  Current complaints: Was diagnosed with bipolar disorder during pregnancy. Has not been on lamictal since after delivery. Has not been able to find someone who takes her insurance.  Does have some pain during cycles. Does not want to be on hormone treatment.   Patient's last menstrual period was 03/14/2022.    Last pap LSIL in 2021. Did not follow up for PAP in 1 year.  Last mammogram: n/a.      No data to display               No data to display           Review of Systems:   Pertinent items are noted in HPI Denies any headaches, blurred vision, fatigue, shortness of breath, chest pain, abdominal pain, abnormal vaginal discharge/itching/odor/irritation, problems with periods, bowel movements, urination, or intercourse unless otherwise stated above. Pertinent History Reviewed:  Reviewed past medical,surgical, social and family history.  Reviewed problem list, medications and allergies. Physical Assessment:   Vitals:   03/28/22 1058  BP: 119/70  Pulse: 75  Weight: 182 lb (82.6 kg)  Height: 5\' 4"  (1.626 m)  Body mass index is 31.24 kg/m.        Physical Examination:   General appearance - well appearing, and in no distress  Mental status - alert, oriented to person, place, and time  Psych:  She has a normal mood and affect  Skin - warm and dry, normal color, no suspicious lesions noted  Chest - effort normal, all lung fields clear to auscultation bilaterally  Heart - normal rate and regular rhythm  Neck:  midline trachea, no thyromegaly or nodules  Breasts - breasts appear normal, no suspicious masses, no skin or nipple changes or axillary nodes  Abdomen - soft, nontender, nondistended, no masses or organomegaly  Pelvic  - VULVA: normal appearing vulva with no masses, tenderness or lesions  VAGINA: normal appearing vagina with normal color and discharge, no lesions  CERVIX: normal appearing cervix without discharge or lesions, no CMT  Thin prep pap is done with HR HPV cotesting  UTERUS: uterus is felt to be normal size, shape, consistency and nontender   ADNEXA: No adnexal masses or tenderness noted.  Extremities:  No swelling or varicosities noted  Chaperone present for exam  Assessment & Plan:  1. Well woman exam with routine gynecological exam Discussed NSAIDs during menses.  - Cytology - PAP( [place])  2. Bipolar affective disorder, remission status unspecified (HCC) Will refer to psych. - Ambulatory referral to Psychiatry  3. LGSIL on Pap smear of cervix PAP today. Discussed gardisil vaccination - patient declined  - Cytology - PAP( [place])   Labs/procedures today:    Orders Placed This Encounter  Procedures   Ambulatory referral to Psychiatry    Meds: No orders of the defined types were placed in this encounter.   Follow-up: No follow-ups on file.  , DO 03/28/2022 12:19 PM

## 2022-03-29 NOTE — Progress Notes (Signed)
    ANNUAL EXAM Patient name: Kelsey Koch MRN [id number]  Date of birth: 10/15/1995 Chief Complaint:   Annual Exam  History of Present Illness:   Kelsey Koch is a 26 y.o.  G1P1001  female  being seen today for a routine annual exam.  Current complaints: Was diagnosed with bipolar disorder during pregnancy. Has not been on lamictal since after delivery. Has not been able to find someone who takes her insurance.  Does have some pain during cycles. Does not want to be on hormone treatment.   Patient's last menstrual period was 03/14/2022.    Last pap LSIL in 2021. Did not follow up for PAP in 1 year.  Last mammogram: n/a.      No data to display               No data to display           Review of Systems:   Pertinent items are noted in HPI Denies any headaches, blurred vision, fatigue, shortness of breath, chest pain, abdominal pain, abnormal vaginal discharge/itching/odor/irritation, problems with periods, bowel movements, urination, or intercourse unless otherwise stated above. Pertinent History Reviewed:  Reviewed past medical,surgical, social and family history.  Reviewed problem list, medications and allergies. Physical Assessment:   Vitals:   03/28/22 1058  BP: 119/70  Pulse: 75  Weight: 182 lb (82.6 kg)  Height: 5' 4" (1.626 m)  Body mass index is 31.24 kg/m.        Physical Examination:   General appearance - well appearing, and in no distress  Mental status - alert, oriented to person, place, and time  Psych:  She has a normal mood and affect  Skin - warm and dry, normal color, no suspicious lesions noted  Chest - effort normal, all lung fields clear to auscultation bilaterally  Heart - normal rate and regular rhythm  Neck:  midline trachea, no thyromegaly or nodules  Breasts - breasts appear normal, no suspicious masses, no skin or nipple changes or axillary nodes  Abdomen - soft, nontender, nondistended, no masses or organomegaly  Pelvic  - VULVA: normal appearing vulva with no masses, tenderness or lesions  VAGINA: normal appearing vagina with normal color and discharge, no lesions  CERVIX: normal appearing cervix without discharge or lesions, no CMT  Thin prep pap is done with HR HPV cotesting  UTERUS: uterus is felt to be normal size, shape, consistency and nontender   ADNEXA: No adnexal masses or tenderness noted.  Extremities:  No swelling or varicosities noted  Chaperone present for exam  Assessment & Plan:  1. Well woman exam with routine gynecological exam Discussed NSAIDs during menses.  - Cytology - PAP( [place])  2. Bipolar affective disorder, remission status unspecified (HCC) Will refer to psych. - Ambulatory referral to Psychiatry  3. LGSIL on Pap smear of cervix PAP today. Discussed gardisil vaccination - patient declined  - Cytology - PAP( [place])   Labs/procedures today:    Orders Placed This Encounter  Procedures   Ambulatory referral to Psychiatry    Meds: No orders of the defined types were placed in this encounter.   Follow-up: No follow-ups on file.  Didier Brandenburg J Lyndie Vanderloop, DO 03/28/2022 12:19 PM 

## 2022-04-02 LAB — CYTOLOGY - PAP
Chlamydia: NEGATIVE
Comment: NEGATIVE
Comment: NEGATIVE
Comment: NORMAL
Diagnosis: NEGATIVE
Neisseria Gonorrhea: NEGATIVE
Trichomonas: NEGATIVE

## 2022-04-23 ENCOUNTER — Ambulatory Visit (HOSPITAL_BASED_OUTPATIENT_CLINIC_OR_DEPARTMENT_OTHER): Payer: 59 | Admitting: Psychiatry

## 2022-04-23 ENCOUNTER — Encounter (HOSPITAL_COMMUNITY): Payer: Self-pay | Admitting: Psychiatry

## 2022-04-23 DIAGNOSIS — F3175 Bipolar disorder, in partial remission, most recent episode depressed: Secondary | ICD-10-CM | POA: Diagnosis not present

## 2022-04-23 MED ORDER — LAMOTRIGINE 25 MG PO TABS: ORAL_TABLET | ORAL | 0 refills | Status: DC
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
## 2022-05-02 ENCOUNTER — Ambulatory Visit (INDEPENDENT_AMBULATORY_CARE_PROVIDER_SITE_OTHER): Payer: Medicaid Other | Admitting: Clinical

## 2022-05-02 ENCOUNTER — Encounter (HOSPITAL_COMMUNITY): Payer: Self-pay

## 2022-05-02 ENCOUNTER — Encounter (HOSPITAL_COMMUNITY): Payer: Self-pay | Admitting: Clinical

## 2022-05-02 DIAGNOSIS — F3175 Bipolar disorder, in partial remission, most recent episode depressed: Secondary | ICD-10-CM | POA: Diagnosis not present

## 2022-05-02 DIAGNOSIS — F4323 Adjustment disorder with mixed anxiety and depressed mood: Secondary | ICD-10-CM
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
## 2022-05-16 ENCOUNTER — Encounter (HOSPITAL_COMMUNITY): Payer: Self-pay | Admitting: Clinical

## 2022-05-16 ENCOUNTER — Ambulatory Visit (INDEPENDENT_AMBULATORY_CARE_PROVIDER_SITE_OTHER): Payer: 59 | Admitting: Clinical

## 2022-05-16 DIAGNOSIS — F3175 Bipolar disorder, in partial remission, most recent episode depressed: Secondary | ICD-10-CM

## 2022-05-16 DIAGNOSIS — F419 Anxiety disorder, unspecified: Secondary | ICD-10-CM

## 2022-05-16 NOTE — Progress Notes (Signed)
THERAPIST PROGRESS NOTE  Session Time: 1:10pm-2:10pm  Participation Level: Active  Behavioral Response: Casual Alert Anxious and Negative  Type of Therapy: Individual Therapy  Treatment Goals addressed:  LTG: Reduce frequency, intensity, and duration of anxiety symptoms so that daily functioning is improved  STG: Be able to discuss anxiety-provoking situations without becoming agitated internally or externally  LTG: Reduce frequency, intensity, and duration of depression symptoms so that daily functioning is improved  STG: Meghan will identify cognitive patterns and beliefs that support depression   ProgressTowards Goals: Progressing  Interventions: CBT and Psychosocial Skills:    Summary: Kelsey Koch is a 27 y.o. female who presents with Bipolar 2 disorder in partial remission, most recent episode depressed, wanting to deal with ongoing stressors in her life. She reported that her mood continued throughout the last 2 weeks to be "easily irritated."  She feels her Lamictal prescription "kind of" helps.  She is having trouble sleeping, states that no matter how exhausted she  is, it is very difficult going to sleep.  She then has a hard time waking up in the morning, which is usually not very difficult for her.  She was encouraged to discuss with her psychiatric provider next week during their appointment.  She also continues to forget to eat and drink, but states she has not lost weight.  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  She states this forgetfulness is not a new characteristic for her.  She stated that talking about all her stressors last time was helpful, especially her grandmother's developing dementia and her mother's alcohol issues.  She reported that her grandmother's memory issue are worsening and her grandfather's health is failing.  She cried while talking about this.  She then brightened and shared that her  mother has been sober for 31 days now.  She also was quite happy when talking about a trip to [place] next week with her father.  Her main topic of discussion today was her fiance and his family, focusing on how difficult they are to get along with.  Last week her fiance told her she is always miserable and that he does not want to marry her any more.  She was calm in discussing this, stated that she would like to be married to him and bring up their daughter in a 2-parent home; however, she said if he does not want that, she would prefer to not have to go through the difficulty and heartache of trying to make it work.  Throughout her descriptions of their arguments, it was clear that communication skills could improve their understanding of each other.  She appeared to be invested in clinging to the idea of how unreasonable his family is, perseverated on them often as therapist attempted to steer the discussion to actionable steps she can take to be happier.  She was insightful enough to be able to identify her ability to give grace to strangers she serves in her job much more readily than with her fiance's family.  She verbalized understanding of therapist's explanation that it is her history with the family that gives more of an opportunity for cognitive distortions to manifest.  Suicidal/Homicidal: No without intent/plan  Therapist Response: Therapist introduced the idea of having a set of communication rules that are discussed and agreed on, specifically with her fiance, while they are calm and not in dispute about anything at all.  As  she shared a variety of instances that their communication has deteriorated into name-calling, yelling, interrupting, and ping ponging to other subjects, therapist used those opportunities to explain the various communication rules.  Prior to the end of the session, therapist printed out "[place]" handout for the patient and reviewed them with her before she  left the session.  She did not make a commitment to review them with her fiance or even to look at them independently.  Therapist also encouraged her to make an attempt to use positive feedback to reinforce any behavior she wants to see repeated by the adults in the home, reminding her that this works with adults just as much as with children.  She perseverated on their faults and did not make a commitment to look for positives.  Therapist introduced the idea of working with her cognitive distortions in the future and she was non-responsive.  Today, it seemed that the patient needed to vent her negative feelings more than start to work on any area to improve.  She was fully open in her sharing however, and we discussed her ability to be insightful as a strength that can help her to improve.  Plan: Return again in 2 weeks.  Diagnosis: Bipolar disorder, in partial remission, most recent episode depressed (HCC) (F31.75) Anxiety disorder, unspecified type (F41.9)  Collaboration of Care: Psychiatrist AEB therapist can read therapy notes, and vice versa  Patient/Guardian was advised Release of Information must be obtained prior to any record release in order to collaborate their care with an outside provider. Patient/Guardian was advised if they have not already done so to contact the registration department to sign all necessary forms in order for [place] to release information regarding their care.   Consent: Patient/Guardian gives verbal consent for treatment and assignment of benefits for services provided during this visit. Patient/Guardian expressed understanding and agreed to proceed.   Recommendations:  Return to therapy in 2 weeks, engage in self care behaviors, focus on communication with fiance, implement 1 new coping skill as taught in session ([place]), and return to next session prepared to talk about experience with that new coping method.   Maretta Los, LCSW 05/16/2022

## 2022-05-21 ENCOUNTER — Ambulatory Visit (HOSPITAL_BASED_OUTPATIENT_CLINIC_OR_DEPARTMENT_OTHER): Payer: 59 | Admitting: Psychiatry

## 2022-05-21 ENCOUNTER — Encounter (HOSPITAL_COMMUNITY): Payer: Self-pay | Admitting: Psychiatry

## 2022-05-21 VITALS — BP 129/80 | HR 82 | Resp 18 | Ht 64.0 in | Wt 185.4 lb

## 2022-05-21 DIAGNOSIS — F3175 Bipolar disorder, in partial remission, most recent episode depressed: Secondary | ICD-10-CM | POA: Diagnosis not present

## 2022-05-21 MED ORDER — LAMOTRIGINE 100 MG PO TABS: 100.0000 mg | ORAL_TABLET | Freq: Every day | ORAL | 1 refills | Status: DC

## 2022-05-21 NOTE — Progress Notes (Signed)
[place] MD/PA/NP OP Progress Note  05/21/2022 12:11 PM Kelsey Koch  MRN:  [id number]  Visit Diagnosis:    ICD-10-CM   1. Bipolar disorder, in partial remission, most recent episode depressed ([place])  F31.75 lamoTRIgine (LAMICTAL) 100 MG tablet      Assessment: Kelsey Koch is a 27 y.o. female with a history of bipolar disorder who presented to [place] [facility] for initial evaluation on 04/23/2022.    During initial evaluation patient reported symptoms of mood lability including both depressive and hypomanic phases.  During the depressive phase patient endorsed low mood, poor hygiene, anhedonia, hopelessness, fatigue, poor appetite, negative self thoughts, guilt, and poor concentration.  She denied any issues with sleep along with any SI/HI.  During the hypomanic phases patient endorsed increased energy, decreased sleep, racing thoughts, and excessive involvement in activities.  These episodes last a day or 2 before resolving.  In the past patient did endorse more significant manic episodes lasting 5 days with little to no sleep increased impulsivity and reckless behaviors.  She denied ever experiencing auditory or visual hallucinations.  Following her pregnancy in 2020 patient reported a shift away from the manic phase and more frequently experienced depression or hypomania.  Of note patient did endorse past history of emotional and verbal trauma in addition to symptoms of poor sense of self, real or perceived feelings of abandonment, and intermittent dissociations.  In the past she has had unstable interpersonal relationships and nonsuicidal self-injury.  Patient met criteria for bipolar disorder most recent phase depressed with further clarity being needed to rule out personality diathesis.  Kelsey Koch presents for follow-up evaluation. Today, 05/21/22, patient reports some improvement in mood lability symptoms, however also an increase in irritability since  starting Lamictal.  In addition patient has noticed decreased appetite/nausea in relation to certain foods.  Discussed alternative medications however patient opted to continue to titrate Lamictal and monitor for change/improvement in her symptoms.  Risk and benefits of Lamictal were discussed.  She has also had a disrupted sleep pattern and it was suggested she start taking melatonin at bedtime to help regulate her sleep cycle.  Patient has connected with a therapist and finds it helpful she was encouraged to continue every other week.  Plan: - Increase Lamictal to 100 mg QD  - Start over-the-counter melatonin 3 mg at bedtime. - CBC, CMP, hepatic function, UA, and BAL reviewed - Continue Therapy once every other week - Crisis resources reviewed - Follow up in 4 weeks  Chief Complaint:  Chief Complaint  Patient presents with   Follow-up   HPI: Kelsey Koch presents reporting that the last month has been alright.  Since starting the Lamictal she reports feeling a bit more neutral or less periods of feeling up.  However there has been minimal change in the feelings of being down or depressed.  She has also noticed some increased irritability/agitation and decreased appetite/nausea since starting the medication.  We reviewed the potential side effects of this medication and treatment options going forward.  Patient notes that she did not have this experience on Lamictal in the past.  After discussion on tapering the medication and trying alternatives versus continuing to titrate the medication, Kelsey Koch was interested in continuing to increase.  The potential risk of triggering a manic or hypomanic episode with an antidepressant and the potential adverse affects of other mood stabilizers in pregnancy were her primary concerns.  Patient notes that she is not currently pregnant though may  plan to try for child in 2025.  Patient also notes that her sleep has been fluctuating over the past month.  Last week she was  having difficulty sleeping and this week she has found herself falling asleep around 6 PM.  Upon waking up in the mornings patient feels more lethargic.  We discussed options and recommended patient start taking melatonin to help better regulate her sleep cycle.  Past Psychiatric History: Was diagnosed with Bipolar disorder during pregnancy in 2020 and started on Lamictal which she was unsure if it was helpful or not.  Denies trying any other psychiatric medications.  Patient denies any prior psychiatric hospitalizations.  She had connected with a therapist in 2020 which discontinued after her pregnancy.  She denies any prior suicide attempts.  Used to smoke marijuana for sleep appetite and anger. Stopped when she was pregnant with her daughter and has started since. Alcohol started at age 27, increased in severity to the point of abuse in 2019. She stopped after that as she was seeing herself like her mother. Cocaine a couple times in her life.   Past Medical History:  Past Medical History:  Diagnosis Date   Medical history non-contributory     Past Surgical History:  Procedure Laterality Date   NO PAST SURGERIES      Family History:  Family History  Problem Relation Age of Onset   Ovarian cysts Mother    Arthritis Mother     Social History:  Social History   Socioeconomic History   Marital status: Single    Spouse name: Not on file   Number of children: Not on file   Years of education: Not on file   Highest education level: Not on file  Occupational History   Not on file  Tobacco Use   Smoking status: Former    Packs/day: 0.25    Years: 3.00    Total pack years: 0.75    Types: Cigarettes    Quit date: 02/2018    Years since quitting: 4.2   Smokeless tobacco: Never  Vaping Use   Vaping Use: Never used  Substance and Sexual Activity   Alcohol use: Not Currently    Alcohol/week: 2.0 standard drinks of alcohol    Types: 2 Glasses of wine per week   Drug use: Never    Sexual activity: Yes    Birth control/protection: None  Other Topics Concern   Not on file  Social History Narrative   Not on file   Social Determinants of Health   Financial Resource Strain: Low Risk  (01/06/2019)   Overall Financial Resource Strain (CARDIA)    Difficulty of Paying Living Expenses: Not hard at all  Food Insecurity: No Food Insecurity (01/06/2019)   Hunger Vital Sign    Worried About Running Out of Food in the Last Year: Never true    Ran Out of Food in the Last Year: Never true  Transportation Needs: No Transportation Needs (01/06/2019)   PRAPARE - Hydrologist (Medical): No    Lack of Transportation (Non-Medical): No  Physical Activity: Sufficiently Active (05/02/2022)   Exercise Vital Sign    Days of Exercise per Week: 4 days    Minutes of Exercise per Session: 40 min  Stress: No Stress Concern Present (05/02/2022)   [place]    Feeling of Stress : Only a little  Social Connections: Not on file    Allergies:  Allergies  Allergen Reactions   Levonorgestrel    Ethinyl Estradiol Other (See Comments)    Agitated    Kiwi Extract Itching   Onion Itching    Current Medications: Current Outpatient Medications  Medication Sig Dispense Refill   lamoTRIgine (LAMICTAL) 100 MG tablet Take 1 tablet (100 mg total) by mouth daily for 60 doses. 30 tablet 1   No current facility-administered medications for this visit.     Musculoskeletal: Strength & Muscle Tone: within normal limits Gait & Station: normal Patient leans: N/A  Psychiatric Specialty Exam: Review of Systems  Blood pressure 129/80, pulse 82, resp. rate 18, height 5\' 4"  (1.626 m), weight 185 lb 6.4 oz (84.1 kg), SpO2 98 %.Body mass index is 31.82 kg/m.  General Appearance: Fairly Groomed  Eye Contact:  Good  Speech:  Clear and Coherent and Normal Rate  Volume:  Normal  Mood:  Depressed  Affect:   Congruent  Thought Process:  Coherent, Goal Directed, and Linear  Orientation:  Full (Time, Place, and Person)  Thought Content: Logical   Suicidal Thoughts:  No  Homicidal Thoughts:  No  Memory:  NA  Judgement:  Good  Insight:  Good  Psychomotor Activity:  Normal  Concentration:  Concentration: Good  Recall:  Good  Fund of Knowledge: Good  Language: Good  Akathisia:  NA    AIMS (if indicated): not done  Assets:  Communication Skills Desire for Improvement Neurosurgeon Vocational/Educational  ADL's:  Intact  Cognition: WNL  Sleep:  Good   Metabolic Disorder Labs: No results found for: "HGBA1C", "MPG" No results found for: "PROLACTIN" No results found for: "CHOL", "TRIG", "HDL", "CHOLHDL", "VLDL", "LDLCALC" No results found for: "TSH"  Therapeutic Level Labs: No results found for: "LITHIUM" No results found for: "VALPROATE" No results found for: "CBMZ"   Screenings: GAD-7    Flowsheet Row Counselor from 05/02/2022 in [place] at [facility] Visit from 04/23/2022 in [facility]  Total GAD-7 Score 4 4      PHQ2-9    [place] Office Visit from 04/23/2022 in [facility]  PHQ-2 Total Score 2  PHQ-9 Total Score 7      Flowsheet Row Counselor from 05/02/2022 in [place] at [facility] Visit from 04/23/2022 in [place] No Risk No Risk       Collaboration of Care: Collaboration of Care: Medication Management AEB medication prescription  Patient/Guardian was advised Release of Information must be obtained prior to any record release in order to collaborate their care with an outside provider. Patient/Guardian was advised if they have not already done so to contact the registration department to sign all  necessary forms in order for [place] to release information regarding their care.   Consent: Patient/Guardian gives verbal consent for treatment and assignment of benefits for services provided during this visit. Patient/Guardian expressed understanding and agreed to proceed.    Kelsey Mink, MD 05/21/2022, 12:11 PM

## 2022-05-30 ENCOUNTER — Ambulatory Visit (INDEPENDENT_AMBULATORY_CARE_PROVIDER_SITE_OTHER): Payer: Medicaid Other | Admitting: Clinical

## 2022-05-30 DIAGNOSIS — F3175 Bipolar disorder, in partial remission, most recent episode depressed: Secondary | ICD-10-CM | POA: Diagnosis not present

## 2022-05-30 DIAGNOSIS — F419 Anxiety disorder, unspecified: Secondary | ICD-10-CM

## 2022-05-30 NOTE — Progress Notes (Signed)
THERAPIST PROGRESS NOTE  Session Time: 1:05pm - 2:05pm  Session #3  Participation Level: Active  Behavioral Response: Casual Alert Tearful and Anxious  Type of Therapy: Individual Therapy  Treatment Goals addressed:  LTG: Reduce frequency, intensity, and duration of anxiety symptoms so that daily functioning is improved  STG: Be able to discuss anxiety-provoking situations without becoming agitated internally or externally  LTG: Reduce frequency, intensity, and duration of depression symptoms so that daily functioning is improved  STG: Micha will identify cognitive patterns and beliefs that support depression   ProgressTowards Goals: Progressing  Interventions: CBT and Meditation: Square Breathing Technique, 4-7-8 breathing  Summary: Kelsey Koch is a 27 y.o. female who presents with Bipolar 2 disorder in partial remission, most recent episode depressed, wanting to deal with ongoing stressors in her life. She shared that she had really looked forward to therapy to discuss recent events in her life.  She just returned a few days ago from a trip to [place] with her father, loves the city and hopes to move there in 8-10 years.  She is slowing titrating her Lamictal upward and feels that she is sleeping better.  She became tearful in talking about her father leaving to return to [place], states she noticed this time that he is getting older and she hates to be separated from him.  Yesterday her stepbrother-In-law went to court on the charge of child pornography, to which he pleaded guilty, with a resulting sentence of 5 years of probation, required to go through rehab and therapy, and to place his name on the sex registry for 15-20 years.  Prior to this judgment, her fiance's family had told her that if he was convicted he would "definitely leave the house" because of her daughter.  She felt they were "baiting" her by talking about this before her fiance got home from work, as they  kept repeating "He can legally live with a minor as long as they are not left alone."  She feels this would be a moral violation of her motherly commitment to protect her child and is unwilling to live there with him.  She was informed that the 3 of them could move in with fiance's grandmother but she does not want to do this for several reasons.  They talked later in the evening and would consider moving to [facility] with her father, but that would be an added burden as they are planning an October wedding closer to here.    CSW explored with her the manner in which she and fiance can approach this decision using the [place] provided at last session.  She stated they looked at them, but then things got busy.  CSW encouraged her to make sure they communicate the most effectively possible by placing some boundaries on this.  She provided an example that last night he asked that they not discuss it anymore and how she had agreed, to which CSW provided positive strokes by following the rules.  She talked about how she wants to move to a big city at some point and the reasons for this, was given the time to explore the differences between her and fiance on this issue and what that might mean.  They both realize that any decision about their living situation made now is for their child and is not necessarily permanent.  She also realizes that it will be hard on her fiance since he has never lived out of his mother's  house previously.  Suicidal/Homicidal: No   Therapist Response: Patient appears to be making progress toward her goals and to feel positive about this.  She was eager to set up future appointments and stated she was really looking forward to coming today to share what was going on, even though it is a difficult thing to deal with.  She was provided positive strokes and encouragement.  Therapist did attempt briefly to challenge some of her thinking, especially when she stated the family was  "baiting" her but she was not ready to look at this currently.  Plan: Return again in 2 weeks.  Recommendations:  Return to therapy in 2 weeks, focus on communication with fiance, try once again to implement [place] with fiance as they talk about a large decision coming up, and use Square Breathing to remain calm, particularly with her in-law family as this decision is discussed, then return to next session prepared to talk about experience with that new coping methods  Diagnosis:  Bipolar disorder, in partial remission, most recent episode depressed ([place]) (F31.75)  Anxiety disorder, unspecified type (F41.9)   Collaboration of Care: Psychiatrist AEB therapist can read therapy notes, and vice versa  Patient/Guardian was advised Release of Information must be obtained prior to any record release in order to collaborate their care with an outside provider. Patient/Guardian was advised if they have not already done so to contact the registration department to sign all necessary forms in order for [place] to release information regarding their care.   Consent: Patient/Guardian gives verbal consent for treatment and assignment of benefits for services provided during this visit. Patient/Guardian expressed understanding and agreed to proceed.      Maretta Los, LCSW 05/30/2022

## 2022-06-13 ENCOUNTER — Ambulatory Visit (INDEPENDENT_AMBULATORY_CARE_PROVIDER_SITE_OTHER): Payer: Medicaid Other | Admitting: Clinical

## 2022-06-13 DIAGNOSIS — F3175 Bipolar disorder, in partial remission, most recent episode depressed: Secondary | ICD-10-CM | POA: Diagnosis not present

## 2022-06-13 DIAGNOSIS — F419 Anxiety disorder, unspecified: Secondary | ICD-10-CM | POA: Diagnosis not present

## 2022-06-13 NOTE — Progress Notes (Unsigned)
THERAPIST PROGRESS NOTE  Session Time: 1:05pm - 2:00pm  Session #4  Participation Level: Active  Behavioral Response: Casual Alert Excited  Type of Therapy: Individual Therapy  Treatment Goals addressed:  LTG: Reduce frequency, intensity, and duration of anxiety symptoms so that daily functioning is improved  STG: Be able to discuss anxiety-provoking situations without becoming agitated internally or externally  LTG: Reduce frequency, intensity, and duration of depression symptoms so that daily functioning is improved  STG: [place] will identify cognitive patterns and beliefs that support depression   ProgressTowards Goals: Progressing  Interventions: CBT and Psychosocial Skills: thought stopping  Summary: HOLLAND KOTTLER is a 27 y.o. female who presents with Bipolar 2 disorder in partial remission, most recent episode depressed, wanting to deal with ongoing stressors in her life. She had come to some conclusions about her half-brother-in-law who has been convicted of a crime and placed on the sex registry for the next 20 years.  He is currently living in the home and she has made it clear to the family that she will not have her daughter live there with him present.  She has decided that if they have not started moving him to another house about 45 minutes away that is available by this coming Sunday, she will pack her things and the baby's and will go to her fiance's grandmother's home.  She stated she has refrained from speaking to this person since the conviction.  While this decision has been difficult, it has come about because the family does not seem to be taking the conviction seriously, whereas she is so concerned about her daughter that she ended up moving from a B in her math class to a finishing grade of D.  She has been waking up anxious, almost to the point of panicking, feeling that something is wrong and her stomach hurting.  She has lost her appetite and has been forgetting  to brush her teeth or hair or put on deodorant before going to work.  These symptoms were all discussed at length.  She was, on the other hand, very eager to share that she and her fiance have come up with a 5-year plan for how to move to [place] to live.  The importance of her having a "carrot" to work toward was discussed.  She feels that the reason they were able to discuss this so thoroughly was because of using their own version of [place] that were shared last week with the patient.  Additionally, she has used the breathing techniques to calm herself, particularly in the morning.  She has been turning off her alarm a total of 5-6 times each morning over the course of 1-1/2 hours and has run the risk of being late to work several times.  We talked about possible solutions to this dilemma that would not interfere with her fiance's or daughter's sleep.  We talked about using Thought Stopping with anxious thoughts, particularly with things interfering with her schoolwork and sleep.  Suicidal/Homicidal: No   Therapist Response: Patient appears to be making progress toward her goals and feeling better.  She was introduced to the idea of Thought Stopping and a handout about the technique was shared with her.  She was excited to be able to tell CSW about the 5-year plan.  She was given the feedback that it seems realistic.  She was supported in her decision to move out of the home if her half-brother-in-law is not moved and  how this is an assertive but not aggressive move.  Plan: Return again in 2 weeks.  Recommendations:  Return to therapy in 2 weeks, use Square Breathing to remain calm, employ Thought Stopping and choose a song to listen to while routinely while doing that technique in order to have a specific association with calming her mood, return to next session prepared to talk about experience with new coping methods   Diagnosis:  Encounter Diagnoses  Name Primary?   Bipolar  disorder, in partial remission, most recent episode depressed ([place]) (F31.75) Yes   Anxiety disorder, unspecified type (F41.9)      Collaboration of Care: Psychiatrist AEB therapist can read therapy notes, and vice versa  Patient/Guardian was advised Release of Information must be obtained prior to any record release in order to collaborate their care with an outside provider. Patient/Guardian was advised if they have not already done so to contact the registration department to sign all necessary forms in order for [place] to release information regarding their care.   Consent: Patient/Guardian gives verbal consent for treatment and assignment of benefits for services provided during this visit. Patient/Guardian expressed understanding and agreed to proceed.    Maretta Los, LCSW 06/13/2022

## 2022-06-14 ENCOUNTER — Encounter (HOSPITAL_COMMUNITY): Payer: Self-pay | Admitting: Clinical

## 2022-06-25 ENCOUNTER — Encounter (HOSPITAL_COMMUNITY): Payer: Self-pay

## 2022-06-25 ENCOUNTER — Ambulatory Visit (HOSPITAL_COMMUNITY): Payer: 59 | Admitting: Psychiatry

## 2022-06-27 ENCOUNTER — Encounter (HOSPITAL_COMMUNITY): Payer: Self-pay | Admitting: Clinical

## 2022-06-27 ENCOUNTER — Ambulatory Visit (INDEPENDENT_AMBULATORY_CARE_PROVIDER_SITE_OTHER): Payer: 59 | Admitting: Clinical

## 2022-06-27 DIAGNOSIS — F3175 Bipolar disorder, in partial remission, most recent episode depressed: Secondary | ICD-10-CM

## 2022-06-27 DIAGNOSIS — F419 Anxiety disorder, unspecified: Secondary | ICD-10-CM

## 2022-06-27 NOTE — Progress Notes (Signed)
THERAPIST PROGRESS NOTE  Session Time: 1:04pm - 1:59pm  Session #5  Participation Level: Active  Behavioral Response: Well Groomed Alert Excited, Hyperverbal  Type of Therapy: Individual Therapy  Treatment Goals addressed:  LTG: Reduce frequency, intensity, and duration of anxiety symptoms so that daily functioning is improved  STG: Be able to discuss anxiety-provoking situations without becoming agitated internally or externally  LTG: Reduce frequency, intensity, and duration of depression symptoms so that daily functioning is improved  STG: [name] will identify cognitive patterns and beliefs that support depression   ProgressTowards Goals: Progressing  Interventions: CBT and Supportive  Summary: Kelsey Koch is a 27 y.o. female who presents with Bipolar 2 disorder in partial remission, most recent episode depressed, wanting to deal with ongoing stressors in her life. She reported that her fiance's step-brother is moving out this coming weekend since the family has been cleaning out the house he is going to.move into.  The patient was hyperverbal throughout the session and kept returning to a self-analysis of whether she is manic or not.  CSW observed that she does not appear manic or even hypomanic, but is definitely excited and hyper.  She mentioned that people in her life all think she has ADHD, which is a possibility.    She stated she has gone off the medication she was taking because when the dosage increased recently, it started making her so anxious that she had a physical reaction.  Throughout the night she would wake up eery 1-1/2 hours in the middle of a panic attack.  She first tried to reduce from the new dose of 100 mg back down to '50mg'$  for a week, but it did not help with the symptoms but this was not helpful so she just stopped the medicine.  CSW looked up  her next appointment and informed her that she missed a doctor appointment a few days ago, and that she should  schedule a new one, which was done for 4/1.  Doctor was also informed.   We then discussed her mood swings.  She stated she will in fact have good energy one day and get a lot done (not at a manic rate but probably due to her enjoyment of the new warmer weather), followed by a day of not getting out of bed.  She stated she feels like "the [employer] that has been wound up too tight" that exerts all her energy one day so there is none left for the next day.  We discussed behavioral activation and methods she may use to keep track of accomplishments versus needs, especially regarding her upcoming wedding that is overwhelming her.  CBT was used to introduce talking about cognitive distortions she may be engaging in and she expressed some fear of her anger outbursts recurring.  Suicidal/Homicidal: No   Therapist Response: Patient is often so random in her topics that she is hard to follow and it is easy to comprehend why she cannot keep track of her own thoughts.  She is certainly cooperative in her therapy sessions and is willing to hear small therapeutic suggestions, but for the most part she is using the sessions to vent.  She does state that she does not feel anyone else wants to hear all her thoughts and they will tell her she is being "too much" so she withholds from them.  In that sense, it an be said she is making progress.  Plan: Return again in 2 weeks.  Recommendations:  Return  to therapy in 2 weeks, use Square Breathing to remain calm, employ Thought Stopping and choose a song to listen to while routinely while doing that technique in order to have a specific association with calming her mood, return to next session prepared to talk about experience with new coping methods   Diagnosis:  Encounter Diagnoses  Name Primary?   Bipolar disorder, in partial remission, most recent episode depressed ([place]) Yes   Anxiety disorder, unspecified type      Collaboration of Care: Psychiatrist AEB  therapist can read therapy notes, and vice versa ; doctor was secure-chatted with information about the patient going off her medicine  Patient/Guardian was advised Release of Information must be obtained prior to any record release in order to collaborate their care with an outside provider. Patient/Guardian was advised if they have not already done so to contact the registration department to sign all necessary forms in order for [place] to release information regarding their care.   Consent: Patient/Guardian gives verbal consent for treatment and assignment of benefits for services provided during this visit. Patient/Guardian expressed understanding and agreed to proceed.    Maretta Los, LCSW 06/27/2022

## 2022-07-11 ENCOUNTER — Ambulatory Visit (HOSPITAL_COMMUNITY): Payer: 59 | Admitting: Clinical

## 2022-07-16 ENCOUNTER — Ambulatory Visit (HOSPITAL_BASED_OUTPATIENT_CLINIC_OR_DEPARTMENT_OTHER): Payer: 59 | Admitting: Psychiatry

## 2022-07-16 ENCOUNTER — Encounter (HOSPITAL_COMMUNITY): Payer: Self-pay | Admitting: Psychiatry

## 2022-07-16 DIAGNOSIS — F3175 Bipolar disorder, in partial remission, most recent episode depressed: Secondary | ICD-10-CM

## 2022-07-16 MED ORDER — OXCARBAZEPINE 150 MG PO TABS: 150.0000 mg | ORAL_TABLET | Freq: Every day | ORAL | 1 refills | Status: AC

## 2022-07-16 NOTE — Progress Notes (Signed)
BH MD/PA/NP OP Progress Note  07/16/2022 1:34 PM Kelsey Koch  MRN:  [id number]  Visit Diagnosis:    ICD-10-CM   1. Bipolar disorder, in partial remission, most recent episode depressed  F31.75 OXcarbazepine (TRILEPTAL) 150 MG tablet      Assessment: Kelsey Koch is a 27 y.o. female with a history of bipolar disorder who presented to [place] [facility] for initial evaluation on 04/23/2022.    During initial evaluation patient reported symptoms of mood lability including both depressive and hypomanic phases.  During the depressive phase patient endorsed low mood, poor hygiene, anhedonia, hopelessness, fatigue, poor appetite, negative self thoughts, guilt, and poor concentration.  She denied any issues with sleep along with any SI/HI.  During the hypomanic phases patient endorsed increased energy, decreased sleep, racing thoughts, and excessive involvement in activities.  These episodes last a day or 2 before resolving.  In the past, patient did endorse more significant manic episodes lasting 5 days with little to no sleep increased impulsivity and reckless behaviors.  She denied ever experiencing auditory or visual hallucinations.  Following her pregnancy in 2020 patient reported a shift away from the manic phase and more frequently experienced depression or hypomania.  Of note patient did endorse past history of emotional and verbal trauma in addition to symptoms of poor sense of self, real or perceived feelings of abandonment, and intermittent dissociations.  In the past she has had unstable interpersonal relationships and nonsuicidal self-injury.  Patient met criteria for bipolar disorder most recent phase depressed with further clarity being needed to rule out personality diathesis.  Kelsey Koch presents for follow-up evaluation. Today, 07/16/22, patient reports increased anxiety and insomnia after increasing Lamictal leading to her discontinuing the  medication. She still struggles with symptoms of mood lability including depression, irritability, and anxiety. We will start patient on Trileptal and can consider an adjunct antidepressant in the future once Trileptal is at a therapeutic dose for mood stabilization. Risks and benefits of this medication were discussed, she will follow up in a month.   Plan: - Discontinue Lamictal due to increased anxiety - Start Trileptal 150 mg QHS - Start over-the-counter melatonin 3 mg at bedtime. - CBC, CMP, hepatic function, UA, and BAL reviewed - Continue therapy once every other week - Crisis resources reviewed - Follow up in 4 weeks  Chief Complaint:  Chief Complaint  Patient presents with   Follow-up   HPI: Kelsey Koch presents reporting that she has had a tough time in the interim. She discontinued Lamictal 100 mg as it  made her increasingly anxious, to the point she was experiencing panic attacks. Patient also notes that it was impacting her sleep. After stopping the Lamictal she had some improvement in her anxiety and sleep symptoms. Since then patient reports that her moods have fluctuated more. She can experience depressive phases where she has difficulty getting out of bed or completing any of her tasks, that can occur a couple times a week. She also endorses other phases of increased irritability out of proportion to stimuli. Other days patient reports that she can be feeling better and accomplishing a lot, but will shut down if she ever stops. At those times she describes it as a feeling of being overwhelmed and not knowing where to start to tackle everything.  We discussed her symptoms and former history of bipolar disorder in regards to treatment. Currently her symptoms appear to be more in line with anxiety/depressive symptoms and would likely  improve from an antidepressant medication. However due to her past bipolar history starting such a medication without any mood stabilization could lead to a  manic episode.  We did review her hypomanic episode from 2019 where she was working 60 hours a week, sleeping a couple hours a night, was grandiose, hypersexual, and engaged in multiple reckless behaviors. Kelsey Koch describes herself as being almost delusional at that time in her grandiosity and recklessness. Those symptoms improved in 2020 after the birth of her daughter and have not reoccurred since. While there is some overlap of symptoms between borderline personality disorder (patient endorsed a family hx of this) and bipolar disorder, her symptoms at that time appear to fit closer to bipolar disorder. It was decided to start the patient on Trileptal 150 mg QD and risks/benefits were reviewed. If tolerated and titrated to appropriate dose then antidepressant medication could be added to augment.   Past Psychiatric History: Was diagnosed with Bipolar disorder during pregnancy in 2020 and started on Lamictal which she was unsure if it was helpful or not.  Denies trying any other psychiatric medications.  Patient denies any prior psychiatric hospitalizations.  She had connected with a therapist in 2020 which discontinued after her pregnancy.  She denies any prior suicide attempts.  Used to smoke marijuana for sleep appetite and anger. Stopped when she was pregnant with her daughter and has started since. Alcohol started at age 61, increased in severity to the point of abuse in 2019. She stopped after that as she was seeing herself like her mother. Cocaine a couple times in her life.   Past Medical History:  Past Medical History:  Diagnosis Date   Medical history non-contributory     Past Surgical History:  Procedure Laterality Date   NO PAST SURGERIES      Family History:  Family History  Problem Relation Age of Onset   Ovarian cysts Mother    Arthritis Mother     Social History:  Social History   Socioeconomic History   Marital status: Single    Spouse name: Not on file   Number of  children: Not on file   Years of education: Not on file   Highest education level: Not on file  Occupational History   Not on file  Tobacco Use   Smoking status: Former    Packs/day: 0.25    Years: 3.00    Additional pack years: 0.00    Total pack years: 0.75    Types: Cigarettes    Quit date: 02/2018    Years since quitting: 4.4   Smokeless tobacco: Never  Vaping Use   Vaping Use: Never used  Substance and Sexual Activity   Alcohol use: Not Currently    Alcohol/week: 2.0 standard drinks of alcohol    Types: 2 Glasses of wine per week   Drug use: Never   Sexual activity: Yes    Birth control/protection: None  Other Topics Concern   Not on file  Social History Narrative   Not on file   Social Determinants of Health   Financial Resource Strain: Low Risk  (01/06/2019)   Overall Financial Resource Strain (CARDIA)    Difficulty of Paying Living Expenses: Not hard at all  Food Insecurity: No Food Insecurity (01/06/2019)   Hunger Vital Sign    Worried About Running Out of Food in the Last Year: Never true    Ran Out of Food in the Last Year: Never true  Transportation Needs: No Transportation Needs (01/06/2019)  PRAPARE - Hydrologist (Medical): No    Lack of Transportation (Non-Medical): No  Physical Activity: Sufficiently Active (05/02/2022)   Exercise Vital Sign    Days of Exercise per Week: 4 days    Minutes of Exercise per Session: 40 min  Stress: No Stress Concern Present (05/02/2022)   [place]    Feeling of Stress : Only a little  Social Connections: Not on file    Allergies:  Allergies  Allergen Reactions   Levonorgestrel    Ethinyl Estradiol Other (See Comments)    Agitated    Kiwi Extract Itching   Onion Itching    Current Medications: Current Outpatient Medications  Medication Sig Dispense Refill   OXcarbazepine (TRILEPTAL) 150 MG tablet Take 1 tablet (150  mg total) by mouth at bedtime. 30 tablet 1   No current facility-administered medications for this visit.     Musculoskeletal: Strength & Muscle Tone: within normal limits Gait & Station: normal Patient leans: N/A  Psychiatric Specialty Exam: Review of Systems  There were no vitals taken for this visit.There is no height or weight on file to calculate BMI.  General Appearance: Fairly Groomed  Eye Contact:  Good  Speech:  Clear and Coherent and Normal Rate  Volume:  Normal  Mood:  Anxious and Depressed  Affect:  Congruent  Thought Process:  Coherent, Goal Directed, and Linear  Orientation:  Full (Time, Place, and Person)  Thought Content: Logical   Suicidal Thoughts:  No  Homicidal Thoughts:  No  Memory:  NA  Judgement:  Good  Insight:  Good  Psychomotor Activity:  Normal  Concentration:  Concentration: Good  Recall:  Good  Fund of Knowledge: Good  Language: Good  Akathisia:  NA    AIMS (if indicated): not done  Assets:  Communication Skills Desire for Improvement Neurosurgeon Vocational/Educational  ADL's:  Intact  Cognition: WNL  Sleep:  Good   Metabolic Disorder Labs: No results found for: "HGBA1C", "MPG" No results found for: "PROLACTIN" No results found for: "CHOL", "TRIG", "HDL", "CHOLHDL", "VLDL", "LDLCALC" No results found for: "TSH"  Therapeutic Level Labs: No results found for: "LITHIUM" No results found for: "VALPROATE" No results found for: "CBMZ"   Screenings: GAD-7    Flowsheet Row Counselor from 05/02/2022 in [place] at [facility] Visit from 04/23/2022 in [facility]  Total GAD-7 Score 4 4      PHQ2-9    [place] Office Visit from 04/23/2022 in [facility]  PHQ-2 Total Score 2  PHQ-9 Total Score 7      Flowsheet Row Counselor from 05/02/2022 in [place] at [facility] Visit from 04/23/2022 in [place] No Risk No Risk       Collaboration of Care: Collaboration of Care: Medication Management AEB medication prescription and Referral or follow-up with counselor/therapist AEB chart review  Patient/Guardian was advised Release of Information must be obtained prior to any record release in order to collaborate their care with an outside provider. Patient/Guardian was advised if they have not already done so to contact the registration department to sign all necessary forms in order for [place] to release information regarding their care.   Consent: Patient/Guardian gives verbal consent for treatment and assignment of benefits for services provided during this visit. Patient/Guardian expressed understanding and agreed  to proceed.    Vista Mink, MD 07/16/2022, 1:34 PM

## 2022-07-25 ENCOUNTER — Ambulatory Visit (INDEPENDENT_AMBULATORY_CARE_PROVIDER_SITE_OTHER): Payer: 59 | Admitting: Clinical

## 2022-07-25 ENCOUNTER — Encounter (HOSPITAL_COMMUNITY): Payer: Self-pay | Admitting: Clinical

## 2022-07-25 DIAGNOSIS — F3175 Bipolar disorder, in partial remission, most recent episode depressed: Secondary | ICD-10-CM

## 2022-07-25 NOTE — Progress Notes (Unsigned)
THERAPIST PROGRESS NOTE  Session Time: 8:08am-9:01am  Session #6  Participation Level: Active  Behavioral Response: Casual Alert Unfocused, Euthymic  Type of Therapy: Individual Therapy  Treatment Goals addressed:  LTG: Reduce frequency, intensity, and duration of anxiety symptoms so that daily functioning is improved  STG: Be able to discuss anxiety-provoking situations without becoming agitated internally or externally  LTG: Reduce frequency, intensity, and duration of depression symptoms so that daily functioning is improved  STG: Kelsey Koch will identify cognitive patterns and beliefs that support depression   ProgressTowards Goals: Progressing  Interventions: CBT and Supportive  Summary: Kelsey Koch is a 27 y.o. female who presents with Bipolar 2 disorder in partial remission, most recent episode depressed, wanting to deal with ongoing stressors in her life. She reported that since her step-brother-in-law moved out of the home because of being a registered sex offender, things have been going well for her because she feels her daughter is safer.  However, her soon-to-be-mother-in-law and step-father-in-law are not getting along because he is sad about his son being forced out of the home.  She continues to demonstrate good boundaries with not getting involved in the dispute between them.  She and her fiance are getting along very well and have not been having disputes.  We talked about the [employer] given to her early in therapy and she continued to assert that even though they have talked about these rules, they have not needed to implement them yet.  She is on an adjusted work schedule for the rest of this month in order to focus on school, which is one of the benefits of working at [employer].  She does not have any anxiety about that.  She does have panic over her upcoming wedding, states that the big items are already done and under control, but it is the small details  she is worried about.  She is using the Thought Stopping technique over and over in relation to taking care of these details, worrying about them excessively and such.  She stated the doctor prescribed Trileptal for her to replace the medicine that was causing some side effects, but she has not yet picked it up.  She was encouraged to do so rather than allowing her mood to feel out-of-control.  We talked about some of the Thinking Traps and she talked about her mind being paralyzed.  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  This led her to no longer want to be an EMT, nurse, or such, where people's lives relied on her.  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  She had also wanted to go into the [employer] and talked at length about that desire and what happened to prevent it (her health).  The latter half of the session was focused on helping her figure out that her medicines do not change who she is, but rather help her to be her true self.  Suicidal/Homicidal: No   Therapist Response: Patient is making progress in therapy although she continues to be so random in her speech that it can seem disjointed and unfocused.  Nonetheless, she does return at times to some previous topics and show comprehension.  CSW provided mood monitoring and treatment progress review in the context of this episode of treatment.  Patient reported that her mood has been "meh".   Patient was able to explore how she likely needs her medicines to feel as soon as she can.  She was encouraged to go ahead and pick up her new medicine rather than going more days without it.  CSW gave patient the opportunity to explore thoughts and feelings associated with current life situations and past/present external stressors as desired.   CSW encouraged patient's expression  of feelings and validated patient's thoughts, using empathy, active listening, open body language, and unconditional positive regard.  Patient demonstrated an orientation to time, place, person and situation.      Plan: Return again in 2 weeks.  Recommendations:  Return to therapy in 2 weeks, engage in self-care activities, continue to try to use Thought Stopping deliberately rather than randomly and choose a song to listen to while doing that technique in order to have a specific association with calming her mood, put thoughts on trial not through emotional reasoning but through evidence for and against  Diagnosis:  Encounter Diagnosis  Name Primary?   Bipolar disorder, in partial remission, most recent episode depressed Yes      Collaboration of Care: Psychiatrist AEB therapist can read therapy notes, and vice versa  Patient/Guardian was advised Release of Information must be obtained prior to any record release in order to collaborate their care with an outside provider. Patient/Guardian was advised if they have not already done so to contact the registration department to sign all necessary forms in order for [place] to release information regarding their care.   Consent: Patient/Guardian gives verbal consent for treatment and assignment of benefits for services provided during this visit. Patient/Guardian expressed understanding and agreed to proceed.    Lynnell Chad, LCSW 07/25/2022

## 2022-08-08 ENCOUNTER — Ambulatory Visit (HOSPITAL_COMMUNITY): Payer: 59 | Admitting: Clinical

## 2022-08-15 ENCOUNTER — Ambulatory Visit (INDEPENDENT_AMBULATORY_CARE_PROVIDER_SITE_OTHER): Payer: Self-pay | Admitting: Clinical

## 2022-08-15 DIAGNOSIS — Z91199 Patient's noncompliance with other medical treatment and regimen due to unspecified reason: Secondary | ICD-10-CM

## 2022-08-15 NOTE — Progress Notes (Signed)
Patient ID: Kelsey Koch, female   DOB: 11-12-1995, 26 y.o.   MRN: [id number]  Therapy Progress Note  Patient had an appointment scheduled with therapist on 08/15/2022  at 9:00am.  When she did not arriveby 9:10am CSW tried calling her at   [phone], with no answer.  A HIPAA-compliant voicemail was left.  An email was sent to [email or web address]  expressing concern that she missed two appointments in a row which is very unlike her.  She still did not arrive by 9:30am, so was considered a no show.  As per [facility] policy, she will be be charged for this no-show appointment.    Encounter Diagnosis  Name Primary?   No-show for appointment Yes     [name], LCSW 08/15/2022, 9:31 AM

## 2022-08-20 ENCOUNTER — Ambulatory Visit (HOSPITAL_COMMUNITY): Payer: 59 | Admitting: Psychiatry

## 2022-09-16 ENCOUNTER — Other Ambulatory Visit (HOSPITAL_COMMUNITY): Payer: Self-pay | Admitting: Psychiatry

## 2022-09-16 DIAGNOSIS — F3175 Bipolar disorder, in partial remission, most recent episode depressed: Secondary | ICD-10-CM

## 2023-09-18 ENCOUNTER — Ambulatory Visit

## 2023-09-18 VITALS — BP 116/69 | HR 98 | Ht 65.0 in | Wt 184.0 lb

## 2023-09-18 DIAGNOSIS — Z3201 Encounter for pregnancy test, result positive: Secondary | ICD-10-CM

## 2023-09-18 DIAGNOSIS — N912 Amenorrhea, unspecified: Secondary | ICD-10-CM

## 2023-09-18 LAB — POCT URINE PREGNANCY: Preg Test, Ur: POSITIVE — AB

## 2023-09-18 NOTE — Progress Notes (Signed)
 Kelsey Koch here for a UPT. Pt had a positive upt at home. LMP is 08/15/2023.     UPT in office Positive.    Reviewed medications and informed to start a PNV, if not already. Pt to follow up in 3 weeks for New OB visit.

## 2023-10-08 ENCOUNTER — Encounter

## 2023-10-14 ENCOUNTER — Other Ambulatory Visit (HOSPITAL_COMMUNITY)
Admission: RE | Admit: 2023-10-14 | Discharge: 2023-10-14 | Disposition: A | Discharge status: Home / Self Care | Source: Ambulatory Visit | Attending: Obstetrics and Gynecology | Admitting: Obstetrics and Gynecology

## 2023-10-14 ENCOUNTER — Ambulatory Visit

## 2023-10-14 VITALS — BP 113/70 | HR 76 | Wt 179.0 lb

## 2023-10-14 DIAGNOSIS — Z3480 Encounter for supervision of other normal pregnancy, unspecified trimester: Secondary | ICD-10-CM | POA: Insufficient documentation

## 2023-10-14 DIAGNOSIS — Z3481 Encounter for supervision of other normal pregnancy, first trimester: Secondary | ICD-10-CM | POA: Diagnosis present

## 2023-10-14 DIAGNOSIS — Z3A08 8 weeks gestation of pregnancy: Secondary | ICD-10-CM | POA: Diagnosis not present

## 2023-10-14 NOTE — Progress Notes (Addendum)
 New OB Intake  I connected with Kelsey Koch  on 10/14/23 at 11:30 AM EDT byin person Visit and verified that I am speaking with the correct person using two identifiers. Nurse is located at [facility] and pt is located at [facility] [address] [place], [place] [address].  I discussed the limitations, risks, security and privacy concerns of performing an evaluation and management service by telephone and the availability of in person appointments. I also discussed with the patient that there may be a patient responsible charge related to this service. The patient expressed understanding and agreed to proceed.  I explained I am completing New OB Intake today. We discussed EDD of Not found.. Pt is [id number]. I reviewed her allergies, medications and Medical/Surgical/OB history.    Patient Active Problem List   Diagnosis Date Noted   Supervision of other normal pregnancy, antepartum 10/14/2023   LGSIL on Pap smear of cervix 04/28/2019   Bipolar disorder (manic depression) (HCC) 11/23/2018   Vaginal itching 09/30/2018     Concerns addressed today  Delivery Plans Plans to deliver at [facility]. Discussed the nature of our practice with multiple providers including residents and students. Due to the size of the practice, the delivering provider may not be the same as those providing prenatal care.   Patient is not interested in water birth.  MyChart/Babyscripts MyChart access verified. I explained pt will have some visits in office and some virtually. Babyscripts instructions given and order placed. Patient verifies receipt of registration text/e-mail. Account successfully created and app downloaded. If patient is a candidate for Optimized scheduling, add to sticky note.   Blood Pressure Cuff/Weight Scale BP cuff was given to patient. Explained after first prenatal appt pt will check weekly and document in Babyscripts. Patient   Anatomy  US  Explained first scheduled US  will be around 19 weeks. Anatomy US  will be scheduled at NOB appointment.  Is patient a CenteringPregnancy candidate?  Declined Declined due to Declined to say Not a candidate due to    Is patient a Mom+Baby Combined Care candidate?  Declined   If accepted, confirm patient does not intend to move from the area for at least 12 months, then notify Mom+Baby staff  Is patient a candidate for Babyscripts Optimization? Yes, patient declined   First visit review I reviewed new OB appt with patient. Explained pt will be seen by Dr. Herchel at first visit. Discussed [place] genetic screening with patient. Routine prenatal labs was drawn at this visit.   Last Pap Diagnosis  Date Value Ref Range Status  03/28/2022   Final   - Negative for intraepithelial lesion or malignancy (NILM)    Sherrill Buikema l Mckinsey Keagle, CMA 10/14/2023  1:16 PM

## 2023-10-15 LAB — CBC/D/PLT+RPR+RH+ABO+RUBIGG...
Antibody Screen: NEGATIVE
Basophils Absolute: 0 10*3/uL (ref 0.0–0.2)
Basos: 0 %
EOS (ABSOLUTE): 0 10*3/uL (ref 0.0–0.4)
Eos: 1 %
HCV Ab: NONREACTIVE
HIV Screen 4th Generation wRfx: NONREACTIVE
Hematocrit: 39.4 % (ref 34.0–46.6)
Hemoglobin: 12.9 g/dL (ref 11.1–15.9)
Hepatitis B Surface Ag: NEGATIVE
Immature Grans (Abs): 0 10*3/uL (ref 0.0–0.1)
Immature Granulocytes: 0 %
Lymphocytes Absolute: 2.5 10*3/uL (ref 0.7–3.1)
Lymphs: 30 %
MCH: 31.7 pg (ref 26.6–33.0)
MCHC: 32.7 g/dL (ref 31.5–35.7)
MCV: 97 fL (ref 79–97)
Monocytes Absolute: 0.5 10*3/uL (ref 0.1–0.9)
Monocytes: 6 %
Neutrophils Absolute: 5.2 10*3/uL (ref 1.4–7.0)
Neutrophils: 63 %
Platelets: 231 10*3/uL (ref 150–450)
RBC: 4.07 x10E6/uL (ref 3.77–5.28)
RDW: 12.6 % (ref 11.7–15.4)
RPR Ser Ql: NONREACTIVE
Rh Factor: POSITIVE
Rubella Antibodies, IGG: 3.26 {index} (ref >0.99)
WBC: 8.3 10*3/uL (ref 3.4–10.8)

## 2023-10-15 LAB — GC/CHLAMYDIA PROBE AMP (~~LOC~~) NOT AT ARMC
Chlamydia: NEGATIVE
Comment: NEGATIVE
Comment: NORMAL
Neisseria Gonorrhea: NEGATIVE

## 2023-10-15 LAB — HCV INTERPRETATION

## 2023-10-16 ENCOUNTER — Ambulatory Visit (INDEPENDENT_AMBULATORY_CARE_PROVIDER_SITE_OTHER)

## 2023-10-16 ENCOUNTER — Other Ambulatory Visit: Payer: Self-pay

## 2023-10-16 ENCOUNTER — Ambulatory Visit: Payer: Self-pay | Admitting: Obstetrics and Gynecology

## 2023-10-16 DIAGNOSIS — Z3A09 9 weeks gestation of pregnancy: Secondary | ICD-10-CM

## 2023-10-16 DIAGNOSIS — O3680X0 Pregnancy with inconclusive fetal viability, not applicable or unspecified: Secondary | ICD-10-CM | POA: Diagnosis not present

## 2023-10-16 DIAGNOSIS — Z3480 Encounter for supervision of other normal pregnancy, unspecified trimester: Secondary | ICD-10-CM

## 2023-10-16 DIAGNOSIS — Z3687 Encounter for antenatal screening for uncertain dates: Secondary | ICD-10-CM | POA: Diagnosis not present

## 2023-10-16 DIAGNOSIS — Z3A08 8 weeks gestation of pregnancy: Secondary | ICD-10-CM

## 2023-10-16 LAB — CULTURE, OB URINE

## 2023-10-16 LAB — URINE CULTURE, OB REFLEX

## 2023-12-09 ENCOUNTER — Encounter: Admitting: Obstetrics & Gynecology
